# Patient Record
Sex: Male | Born: 1960 | Race: White | Hispanic: No | Smoking: Never smoker
Health system: Southern US, Community
[De-identification: ages and names within clinical notes are randomized; demographics above are authoritative.]

## PROBLEM LIST (undated history)

## (undated) DIAGNOSIS — G4730 Sleep apnea, unspecified: Secondary | ICD-10-CM

## (undated) DIAGNOSIS — F609 Personality disorder, unspecified: Secondary | ICD-10-CM

## (undated) DIAGNOSIS — F32A Depression, unspecified: Secondary | ICD-10-CM

## (undated) DIAGNOSIS — I519 Heart disease, unspecified: Secondary | ICD-10-CM

## (undated) DIAGNOSIS — I10 Essential (primary) hypertension: Secondary | ICD-10-CM

## (undated) DIAGNOSIS — G4700 Insomnia, unspecified: Secondary | ICD-10-CM

## (undated) DIAGNOSIS — F4310 Post-traumatic stress disorder, unspecified: Secondary | ICD-10-CM

## (undated) DIAGNOSIS — I421 Obstructive hypertrophic cardiomyopathy: Secondary | ICD-10-CM

## (undated) DIAGNOSIS — F419 Anxiety disorder, unspecified: Secondary | ICD-10-CM

## (undated) DIAGNOSIS — E785 Hyperlipidemia, unspecified: Secondary | ICD-10-CM

## (undated) DIAGNOSIS — K219 Gastro-esophageal reflux disease without esophagitis: Secondary | ICD-10-CM

## (undated) DIAGNOSIS — E119 Type 2 diabetes mellitus without complications: Secondary | ICD-10-CM

## (undated) DIAGNOSIS — F329 Major depressive disorder, single episode, unspecified: Secondary | ICD-10-CM

## (undated) HISTORY — DX: Heart disease, unspecified: I51.9

## (undated) HISTORY — DX: Obstructive hypertrophic cardiomyopathy: I42.1

## (undated) HISTORY — DX: Insomnia, unspecified: G47.00

## (undated) HISTORY — DX: Personality disorder, unspecified: F60.9

## (undated) HISTORY — DX: Post-traumatic stress disorder, unspecified: F43.10

## (undated) HISTORY — DX: Hyperlipidemia, unspecified: E78.5

## (undated) HISTORY — PX: OTHER SURGICAL HISTORY: SHX169

## (undated) HISTORY — DX: Type 2 diabetes mellitus without complications: E11.9

## (undated) HISTORY — DX: Gastro-esophageal reflux disease without esophagitis: K21.9

## (undated) HISTORY — DX: Sleep apnea, unspecified: G47.30

## (undated) HISTORY — PX: TONSILLECTOMY: SUR1361

---

## 2001-03-26 ENCOUNTER — Emergency Department (HOSPITAL_COMMUNITY): Admission: EM | Admit: 2001-03-26 | Discharge: 2001-03-26 | Payer: Self-pay | Admitting: Emergency Medicine

## 2001-03-26 ENCOUNTER — Encounter: Payer: Self-pay | Admitting: Emergency Medicine

## 2001-04-26 ENCOUNTER — Ambulatory Visit (HOSPITAL_COMMUNITY): Admission: RE | Admit: 2001-04-26 | Discharge: 2001-04-26 | Payer: Self-pay | Admitting: Cardiovascular Disease

## 2001-07-30 ENCOUNTER — Ambulatory Visit (HOSPITAL_BASED_OUTPATIENT_CLINIC_OR_DEPARTMENT_OTHER): Admission: RE | Admit: 2001-07-30 | Discharge: 2001-07-30 | Payer: Self-pay | Admitting: Pulmonary Disease

## 2003-06-19 ENCOUNTER — Ambulatory Visit (HOSPITAL_BASED_OUTPATIENT_CLINIC_OR_DEPARTMENT_OTHER): Admission: RE | Admit: 2003-06-19 | Discharge: 2003-06-19 | Payer: Self-pay | Admitting: Pulmonary Disease

## 2004-10-24 ENCOUNTER — Ambulatory Visit: Payer: Self-pay | Admitting: Pulmonary Disease

## 2012-03-02 ENCOUNTER — Encounter: Payer: Self-pay | Admitting: *Deleted

## 2013-04-04 ENCOUNTER — Encounter: Payer: Self-pay | Admitting: Cardiovascular Disease

## 2014-01-07 ENCOUNTER — Encounter (HOSPITAL_COMMUNITY): Payer: Self-pay | Admitting: Emergency Medicine

## 2014-01-07 ENCOUNTER — Emergency Department (INDEPENDENT_AMBULATORY_CARE_PROVIDER_SITE_OTHER)
Admission: EM | Admit: 2014-01-07 | Discharge: 2014-01-07 | Disposition: A | Discharge status: Home / Self Care | Payer: Medicaid Other | Source: Home / Self Care | Attending: Emergency Medicine | Admitting: Emergency Medicine

## 2014-01-07 DIAGNOSIS — R739 Hyperglycemia, unspecified: Secondary | ICD-10-CM

## 2014-01-07 DIAGNOSIS — R7309 Other abnormal glucose: Secondary | ICD-10-CM

## 2014-01-07 DIAGNOSIS — I10 Essential (primary) hypertension: Secondary | ICD-10-CM

## 2014-01-07 HISTORY — DX: Essential (primary) hypertension: I10

## 2014-01-07 HISTORY — DX: Depression, unspecified: F32.A

## 2014-01-07 HISTORY — DX: Anxiety disorder, unspecified: F41.9

## 2014-01-07 HISTORY — DX: Major depressive disorder, single episode, unspecified: F32.9

## 2014-01-07 LAB — POCT I-STAT, CHEM 8
BUN: 10 mg/dL (ref 6–23)
CALCIUM ION: 1.18 mmol/L (ref 1.12–1.23)
CREATININE: 1 mg/dL (ref 0.50–1.35)
Chloride: 104 mEq/L (ref 96–112)
GLUCOSE: 175 mg/dL — AB (ref 70–99)
HCT: 51 % (ref 39.0–52.0)
Hemoglobin: 17.3 g/dL — ABNORMAL HIGH (ref 13.0–17.0)
Potassium: 4.6 mEq/L (ref 3.7–5.3)
SODIUM: 143 meq/L (ref 137–147)
TCO2: 27 mmol/L (ref 0–100)

## 2014-01-07 MED ORDER — CARVEDILOL 12.5 MG PO TABS: 12.5000 mg | ORAL_TABLET | Freq: Two times a day (BID) | ORAL | Status: DC

## 2014-01-07 MED ORDER — LISINOPRIL 20 MG PO TABS: 20.0000 mg | ORAL_TABLET | Freq: Every day | ORAL | Status: DC

## 2014-01-07 NOTE — Discharge Instructions (Signed)
Blood pressure over the ideal can put you at higher risk for stroke, heart disease, and kidney failure.  For this reason, it's important to try to get your blood pressure as close as possible to the ideal. ° °The ideal blood pressure is 120/80.  Blood pressures from 120-139 systolic over 80-89 diastolic are labeled as "prehypertension."  This means you are at higher risk of developing hypertension in the future.  Blood pressures in this range are not treated with medication, but lifestyle changes are recommended to prevent progression to hypertension.  Blood pressures of 140 and above systolic over 90 and above diastolic are classified as hypertension and are treated with medications. ° °Lifestyle changes which can benefit both prehypertension and hypertension include the following: ° °· Salt and sodium restriction. °· Weight loss. °· Regular exercise. °· Avoidance of tobacco. °· Avoidance of excess alcohol. °· The "D.A.S.H" diet. ° °· People with hypertension and prehypertension should limit their salt intake to less than 1500 mg daily.  Reading the nutrition information on the label of many prepared foods can give you an idea of how much sodium you're consuming at each meal.  Remember that the most important number on the nutrition information is the serving size.  It may be smaller than you think.  Try to avoid adding extra salt at the table.  You may add small amounts of salt while cooking.  Remember that salt is an acquired taste and you may get used to a using a whole lot less salt than you are using now.  Using less salt lets the food's natural flavors come through.  You might want to consider using salt substitutes, potassium chloride, pepper, or blends of herbs and spices to enhance the flavor of your food.  Foods that contain the most salt include: processed meats (like ham, bacon, lunch meat, sausage, hot dogs, and breakfast meat), chips, pretzels, salted nuts, soups, salty snacks, canned foods, junk  food, fast food, restaurant food, mustard, pickles, pizza, popcorn, soy sauce, and worcestershire sauce--quite a list!  You might ask, "Is there anything I can eat?"  The answer is, "yes."  Fruits and vegetables are usually low in salt.  Fresh is better than frozen which is better than canned.  If you have canned vegetables, you can cut down on the salt content by rinsing them in tap water 3 times before cooking.   ° °· Weight loss is the second thing you can do to lower your blood pressure.  Getting to and maintaining ideal weight will often normalize your blood pressure and allow you to avoid medications, entirely, cut way down on your dosage of medications, or allow to wean off your meds.  (Note, this should only be done under the supervision of your primary care doctor.)  Of course, weight loss takes time and you may need to be on medication in the meantime.  You shoot for a body mass index of 20-25.  When you go to the urgent care or to your primary care doctor, they should calculate your BMI.  If you don't know what it is, ask.  You can calculate your BMI with the following formula:  Weight in pounds x 703/ (height in inches) x (height in inches).  There are many good diets out there: Weight Watchers and the D.A.S.H. Diet are the best, but often, just modifying a few factors can be helpful:  Don't skip meals, don't eat out, and keeping a food diary.  I do not recommend   fad diets or diet pills which often raise blood pressure.  ° °· Everyone should get regular exercise, but this is particularly important for people with high blood pressure.  Just about any exercise is good.  The only exercise which may be harmful is lifting extreme heavy weights.  I recommend moderate exercise such as walking for 30 minutes 5 days a week.  Going to the gym for a 50 minute workout 3 times a week is also good.  This amounts to 150 minutes of exercise weekly. ° °· Anyone with high blood pressure should avoid any use of tobacco.   Tobacco use does not elevate blood pressure, but it increases the risk of heart disease and stroke.  If you are interested in quitting, discuss with your doctor how to quit.  If you are not interested in quitting, ask yourself, "What would my life be like in 10 years if I continue to smoke?"  "How will I know when it is time to quit?"  "How would my life be better if I were to quit." ° °· Excess alcohol intake can raise the blood pressure.  The safe alcohol intake is 2 drinks or less per day for men and 1 drink per day or less for women. ° °· There is a very good diet which I recommend that has been designed for people with blood pressure called the D.A.S.H. Diet (dietary approaches to stop hypertension).  It consists of fruits, vegetables, lean meats, low fat dairy, whole grains, nuts and seeds.  It is very low in salt and sodium.  It has also been found to have other beneficial health effects such as lowering cholesterol and helping lose weight.  It has been developed by the [employer] of [employer] and can be downloaded from the internet without any cost. Just do a web search on "D.A.S.H. Diet." or go the NIH website (www.nih.gov).  There are also cookbooks and diet plans that can be gotten from [employer] to help you with this diet. °·  °

## 2014-01-07 NOTE — ED Provider Notes (Signed)
Chief Complaint:   Chief Complaint  Patient presents with  . Hypertension    History of Present Illness:   Richard [name] is a 53 year old male who has a many year history of high blood pressure. He had been under the care of Dr. Delane Ginger for this. He also has high output congestive heart failure. He had been on lisinopril of unknown dose and carvedilol 25 mg twice a day. With this his blood pressure had been under good control and he had had no medication side effects. He was let go from his job, lost his insurance, and unable to followup with Dr. Elease Hashimoto. He's been off of his medications for months. Yesterday he became concerned and checked his blood pressure. It was 180/110 and 200/127. He notes he's gained weight over the past year, but has cut out sweets and sodas and is losing weight again. Yesterday he felt lightheaded and had a panic attack. He denies any headaches, diplopia, blurry vision, shortness of breath, chest pain, tightness, pressure, edema, or strokelike symptoms. He has no history of diabetes, elevated cholesterol, coronary artery disease, kidney disease, or cigarette smoking.  Review of Systems:  Other than noted above, the patient denies any of the following symptoms: Systemic:  No fever, chills, fatigue, weight loss or gain. Respiratory:  No coughing, wheezing, or shortness of breath. Cardiac:  No chest pain, tightness, pressure, palpitations, syncope, or edema. Neuro:  No headache, dizziness, blurred vision, weakness, paresthesias, or strokelike symptoms.  PMFSH:  Past medical history, family history, social history, meds, and allergies were reviewed.  Physical Exam:   Vital signs:  BP 162/97  Pulse 107  Temp(Src) 98.8 F (37.1 C) (Oral)  Resp 18  SpO2 97% General:  Alert, oriented, in no distress. Lungs:  Breath sounds clear and equal bilaterally.  No wheezes, rales, or rhonchi. Heart:  Regular rhythm, no gallops, murmers, clicks or rubs.  Abdomen:  Soft and  flat.  Nontender, no organomegaly or mass.  No pulsatile midline abdominal mass or bruit. Ext:  No edema, pulses full. Neurological exam:  Alert and oriented.  Speech is clear.  No pronator drift.  CNs intact.  Labs:   Results for orders placed during the hospital encounter of 01/07/14  POCT I-STAT, CHEM 8      Result Value Range   Sodium 143  137 - 147 mEq/L   Potassium 4.6  3.7 - 5.3 mEq/L   Chloride 104  96 - 112 mEq/L   BUN 10  6 - 23 mg/dL   Creatinine, Ser 0.03  0.50 - 1.35 mg/dL   Glucose, Bld 491 (*) 70 - 99 mg/dL   Calcium, Ion 7.91  5.05 - 1.23 mmol/L   TCO2 27  0 - 100 mmol/L   Hemoglobin 17.3 (*) 13.0 - 17.0 g/dL   HCT 69.7  94.8 - 01.6 %    Assessment:  The primary encounter diagnosis was Hypertension. A diagnosis of Hyperglycemia was also pertinent to this visit.  Discussed the need to find a primary care physician and he was referred to our [facility] and [facility]. I also discussed salt and sodium restriction as well as his elevated blood glucose. For right now not going to start him on any medication for this, I prefer that he followup with a primary care physician. I told him that weight loss and better diet as well as exercise would definitely help this.  Plan:   1.  Meds:  The following meds were prescribed:  Discharge Medication List as of 01/07/2014  4:50 PM    START taking these medications   Details  !! carvedilol (COREG) 12.5 MG tablet Take 1 tablet (12.5 mg total) by mouth 2 (two) times daily with a meal., Starting 01/07/2014, Until Discontinued, Normal    !! lisinopril (PRINIVIL,ZESTRIL) 20 MG tablet Take 1 tablet (20 mg total) by mouth daily., Starting 01/07/2014, Until Discontinued, Normal     !! - Potential duplicate medications found. Please discuss with provider.      2.  Patient Education/Counseling:  The patient was given appropriate handouts, self care instructions, and instructed in symptomatic relief. Specifically discussed salt and  sodium restriction, weight control, and exercise.  3.  Follow up:  The patient was told to follow up if no better in 3 to 4 days, if becoming worse in any way, and given some red flag symptoms such as severe headache, blurry vision, shortness of breath, chest pain, or any new neurological symptoms which would prompt immediate return.  [facility] within the next month, he has enough medication to last for 3 months.     Richard Likesavid C Zalmen Wrightsman, MD 01/07/14 2038

## 2014-01-07 NOTE — ED Notes (Signed)
Pt c/o HTN... Reports he has been out of Coreg and Lisinopril onset 5 yrs due to loss of health ins Denies: CP, SOB, nauseas, diaphoresis Hx of depression, anxiety, CHF He is alert w/no signs of acute distress.

## 2015-10-25 ENCOUNTER — Ambulatory Visit (INDEPENDENT_AMBULATORY_CARE_PROVIDER_SITE_OTHER): Payer: Medicaid Other | Admitting: Neurology

## 2015-10-25 ENCOUNTER — Encounter: Payer: Self-pay | Admitting: Neurology

## 2015-10-25 VITALS — BP 124/84 | HR 76 | Resp 20

## 2015-10-25 DIAGNOSIS — J301 Allergic rhinitis due to pollen: Secondary | ICD-10-CM

## 2015-10-25 DIAGNOSIS — G4710 Hypersomnia, unspecified: Secondary | ICD-10-CM

## 2015-10-25 DIAGNOSIS — F4312 Post-traumatic stress disorder, chronic: Secondary | ICD-10-CM

## 2015-10-25 DIAGNOSIS — G4730 Sleep apnea, unspecified: Secondary | ICD-10-CM | POA: Diagnosis not present

## 2015-10-25 DIAGNOSIS — J0120 Acute ethmoidal sinusitis, unspecified: Secondary | ICD-10-CM

## 2015-10-25 MED ORDER — BUDESONIDE 32 MCG/ACT NA SUSP: 1.0000 | Freq: Every day | NASAL | Status: DC

## 2015-10-25 NOTE — Patient Instructions (Signed)
Posttraumatic Stress Disorder Posttraumatic stress disorder (PTSD) is a mental disorder. It occurs after a traumatic event in your life. The traumatic events that cause PTSD are outside the range of normal human experience. Examples of these events include war, automobile accidents, natural disasters, rape, domestic violence, and violent crimes. Most people who experience these types of events are able to heal on their own. Those who do not heal develop PTSD. PTSD can happen to anyone at any age. However, people with a history of childhood abuse are at increased risk for developing PTSD.  SYMPTOMS  The traumatic event that causes PTSD must be a threat to life, cause serious injury, or involve sexual violence. The traumatic event is usually experienced directly by the person who develops PTSD. Sometimes PTSD occurs in people who witness traumas that occur to others or who hear about a trauma that occurs to a close family member or friend. The following behaviors are characteristic of people with PTSD:  People with PTSD re-experience the traumatic event in one or more of the following ways (intrusion symptoms):  Recurrent, unwanted distressing memories while awake.  Recurrent distressing dreams.  Sensations similar to those felt when the event originally occurred (flashbacks).   Intense or prolonged emotional distress, triggered by reminders of the trauma. This may include fear, horror, intense sadness, or anger.  Marked physical reactions, triggered by reminders of the trauma. This may include racing heart, shortness of breath, sweating, and shaking.  People with PTSD avoid thoughts, conversations, people, or activities that remind them of the traumatic event (avoidance symptoms).  People with PTSD have negative changes in their thinking and mood after the traumatic event. These changes include:  Inability to remember one or more significant aspects of the traumatic event (memory  gaps).  Exaggerated negative perceptions about themselves or others, such as believing that they are bad people or that no one can be trusted.  Unrealistic assignment of blame to themselves or others for the traumatic event.  Persistent negative emotional state, such as fear, horror, anger, sadness, guilt, or shame.  Markedly decreased interest or participation in significant activities.  A loss of connection with other people.  Inability to experience positive emotions, such as happiness or love.  People with PTSD are more sensitive to their environment and react more easily than others (hyperarousal-overreactivity symptoms). These symptoms include:  Irritability, with angry outbursts toward other people or objects. The outbursts are easily triggered and may be verbal or physical.  Careless or self-destructive behavior. This may include reckless driving or drug use.  A feeling of being on edge, with increased alertness (hypervigilance).  Exaggerated reactions to stimuli, such as being easily startled.   Difficulty concentrating.  Difficulty sleeping. PTSD symptoms may start soon after a frightening event or months or years later. They last at least 1 month or longer and can affect one or more areas of functioning, such as social or occupational functioning.  DIAGNOSIS  PTSD is diagnosed through an assessment by a mental health professional. You will be asked questions about the traumatic events in your life. You will also be asked about how these events have changed your thoughts, mood, behavior, and ability to function on a daily basis. You may be asked about your use of alcohol or drugs, which can make PTSD symptoms worse. TREATMENT  Unlike many mental disorders, which require lifelong management, PTSD is a curable condition. The goal of PTSD treatment is to neutralize the negative effects of the traumatic event on daily   functioning, not erase the memory of the event. The  following treatments may be prescribed to reach this goal:  Medicines. Certain medicines can reduce some PTSD symptoms. Intrusion symptoms and hyperarousal-overactivity symptoms respond best to medicines.  Counseling (talk therapy). Talk therapy with a mental health professional who is experienced in treating PTSD can help. Talk therapy can provide education, emotional support, and coping skills. Certain types of talk therapy that specifically target the traumatic events are the most effective treatment for PTSD:  Prolonged exposure therapy, which involves remembering and processing the traumatic event with a therapist in a safe environment until it no longer creates a negative emotional response.  Eye movement desensitization and reprocessing therapy, which involves the use of repetitive physical stimulation of the senses that alternates between the right and left sides of the body. It is believed that this therapy facilitates communication between the two sides of the brain. This communication helps the mind to integrate the fragmented memories of the traumatic event into a whole story that makes sense and no longer creates a negative emotional response. Most people with PTSD benefit from a combination of these treatments.    This information is not intended to replace advice given to you by your health care provider. Make sure you discuss any questions you have with your health care provider.   Document Released: 09/02/2001 Document Revised: 12/29/2014 Document Reviewed: 02/24/2013 [employer] Interactive Patient Education 2016 [employer]  
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
## 2015-10-30 ENCOUNTER — Ambulatory Visit (INDEPENDENT_AMBULATORY_CARE_PROVIDER_SITE_OTHER): Payer: Medicaid Other | Admitting: Neurology

## 2015-10-30 DIAGNOSIS — G4710 Hypersomnia, unspecified: Secondary | ICD-10-CM

## 2015-10-30 DIAGNOSIS — G4730 Sleep apnea, unspecified: Secondary | ICD-10-CM | POA: Diagnosis not present

## 2015-10-30 DIAGNOSIS — F4312 Post-traumatic stress disorder, chronic: Secondary | ICD-10-CM

## 2015-10-30 DIAGNOSIS — J301 Allergic rhinitis due to pollen: Secondary | ICD-10-CM

## 2015-10-30 DIAGNOSIS — J0120 Acute ethmoidal sinusitis, unspecified: Secondary | ICD-10-CM

## 2015-11-12 ENCOUNTER — Ambulatory Visit (INDEPENDENT_AMBULATORY_CARE_PROVIDER_SITE_OTHER): Payer: Medicaid Other | Admitting: Neurology

## 2015-11-12 DIAGNOSIS — G4730 Sleep apnea, unspecified: Secondary | ICD-10-CM

## 2015-11-12 DIAGNOSIS — J301 Allergic rhinitis due to pollen: Secondary | ICD-10-CM

## 2015-11-12 DIAGNOSIS — G4710 Hypersomnia, unspecified: Secondary | ICD-10-CM | POA: Diagnosis not present

## 2015-11-12 NOTE — Sleep Study (Signed)
Please see the scanned sleep study interpretation located in the Procedure tab within the Chart Review section. 

## 2015-11-13 ENCOUNTER — Telehealth: Payer: Self-pay

## 2015-11-13 DIAGNOSIS — G4733 Obstructive sleep apnea (adult) (pediatric): Secondary | ICD-10-CM

## 2015-11-13 NOTE — Telephone Encounter (Signed)
Called pt to discuss sleep study results. No answer, left a message asking him to call me back. 

## 2015-11-14 NOTE — Telephone Encounter (Signed)
Spoke to pt and advised him that his PSG revealed severest osa and immediate treatment is advised. PAP therapy is indicated. I advised him that Dr. Vickey Huger recommended an attended PAP titration study to optimize therapy. Pt is willing to come in for a cpap titration. Pt understands that our sleep lab will call him to schedule the appt.  I advised him to avoid sedative hypnotics and alcohol which may worsen osa. I advise him to lose weight, diet, and exercise if not contraindicated. I advised him to avoid driving or operating hazardous machinery while sleepy. I also advised him that he might need supplemental oxygen. Pt verbalized understanding.

## 2015-11-14 NOTE — Telephone Encounter (Signed)
Called pt to discuss sleep study results. No answer, left a message asking him to call back. 

## 2015-12-10 ENCOUNTER — Ambulatory Visit (INDEPENDENT_AMBULATORY_CARE_PROVIDER_SITE_OTHER): Payer: Medicaid Other | Admitting: Neurology

## 2015-12-10 DIAGNOSIS — G4733 Obstructive sleep apnea (adult) (pediatric): Secondary | ICD-10-CM

## 2015-12-10 NOTE — Sleep Study (Signed)
Please see the scanned sleep study interpretation located in the Procedure tab within the Chart Review section. 

## 2015-12-12 ENCOUNTER — Telehealth: Payer: Self-pay

## 2015-12-12 NOTE — Telephone Encounter (Signed)
Spoke to pt regarding his sleep study results. I advised pt that his PAP titration was a difficult and incomplete study. No optimal pressure could be identified on CPAP. Dr. Vickey Huger recommends starting a BiPAP auto form 10/6 to 20/16 cm H2O. Pt states that he has tried Bipap before and did not tolerate it very well. He is unsure about this. He wants a follow up appt to discuss further what to do. Appt was made for 12/27 at 1:30. Pt verbalized understanding.

## 2015-12-12 NOTE — Telephone Encounter (Signed)
Called to discuss pt's sleep study. No answer, left message asking him to call me back.

## 2015-12-18 ENCOUNTER — Ambulatory Visit (INDEPENDENT_AMBULATORY_CARE_PROVIDER_SITE_OTHER): Payer: Medicaid Other | Admitting: Neurology

## 2015-12-18 ENCOUNTER — Encounter: Payer: Self-pay | Admitting: Neurology

## 2015-12-18 VITALS — BP 144/82 | HR 88 | Resp 20 | Ht 72.0 in | Wt 308.0 lb

## 2015-12-18 DIAGNOSIS — G4733 Obstructive sleep apnea (adult) (pediatric): Secondary | ICD-10-CM | POA: Diagnosis not present

## 2015-12-18 NOTE — Addendum Note (Signed)
Addended by: Geronimo Running A on: 12/18/2015 02:13 PM   Modules accepted: Orders
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
## 2016-02-12 ENCOUNTER — Telehealth: Payer: Self-pay

## 2016-02-12 NOTE — Telephone Encounter (Signed)
Dr. Vickey Huger is going to be out of the office 02/28/2016. Pt's appt needs to be rescheduled.  He has just started bipap on 01/31/2016 and insurance will require 60-90 days of data on bipap at pt's appt with Dr. Vickey Huger. Therefore, pt cannot be rescheduled with Dr. Vickey Huger until after 03/30/16.  Can you please call pt and reschedule him for after 03/30/16 for his bipap follow up?

## 2016-02-28 ENCOUNTER — Ambulatory Visit: Payer: Medicaid Other | Admitting: Neurology

## 2016-03-31 ENCOUNTER — Ambulatory Visit (INDEPENDENT_AMBULATORY_CARE_PROVIDER_SITE_OTHER): Payer: Medicaid Other | Admitting: Neurology

## 2016-03-31 ENCOUNTER — Encounter: Payer: Self-pay | Admitting: *Deleted

## 2016-03-31 ENCOUNTER — Encounter: Payer: Self-pay | Admitting: Neurology

## 2016-03-31 VITALS — BP 135/78 | HR 72 | Resp 12 | Ht 72.0 in | Wt 313.5 lb

## 2016-03-31 DIAGNOSIS — J310 Chronic rhinitis: Secondary | ICD-10-CM | POA: Diagnosis not present

## 2016-03-31 DIAGNOSIS — G4733 Obstructive sleep apnea (adult) (pediatric): Secondary | ICD-10-CM | POA: Diagnosis not present

## 2016-03-31 MED ORDER — FLUTICASONE PROPIONATE 50 MCG/ACT NA SUSP: 1.0000 | Freq: Every day | NASAL | Status: DC

## 2016-03-31 NOTE — Progress Notes (Signed)
Faxed printed rx fluticasone to pt pharmacy. Fax: [phone]. Received confirmation.
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
## 2016-03-31 NOTE — Patient Instructions (Signed)

## 2016-10-02 ENCOUNTER — Encounter: Payer: Self-pay | Admitting: Gastroenterology

## 2016-10-30 ENCOUNTER — Ambulatory Visit: Payer: Medicaid Other | Admitting: *Deleted

## 2016-10-30 VITALS — Ht 72.0 in | Wt 305.4 lb

## 2016-10-30 DIAGNOSIS — Z1211 Encounter for screening for malignant neoplasm of colon: Secondary | ICD-10-CM

## 2016-10-30 MED ORDER — NA SULFATE-K SULFATE-MG SULF 17.5-3.13-1.6 GM/177ML PO SOLN: 1.0000 | Freq: Once | ORAL | 0 refills | Status: AC

## 2016-10-30 NOTE — Progress Notes (Signed)
Denies allergies to eggs or soy products. Denies complications with sedation or anesthesia. Denies O2 use. Denies use of diet or weight loss medications.  Emmi instructions given for colonoscopy.  

## 2016-11-27 ENCOUNTER — Encounter: Payer: Self-pay | Admitting: Gastroenterology

## 2016-11-27 ENCOUNTER — Ambulatory Visit (AMBULATORY_SURGERY_CENTER): Payer: Medicaid Other | Admitting: Gastroenterology

## 2016-11-27 VITALS — BP 102/69 | HR 76 | Temp 95.7°F | Resp 12 | Ht 72.0 in | Wt 305.0 lb

## 2016-11-27 DIAGNOSIS — Z1211 Encounter for screening for malignant neoplasm of colon: Secondary | ICD-10-CM | POA: Diagnosis not present

## 2016-11-27 DIAGNOSIS — Z1212 Encounter for screening for malignant neoplasm of rectum: Secondary | ICD-10-CM | POA: Diagnosis not present

## 2016-11-27 LAB — GLUCOSE, CAPILLARY
GLUCOSE-CAPILLARY: 168 mg/dL — AB (ref 65–99)
Glucose-Capillary: 155 mg/dL — ABNORMAL HIGH (ref 65–99)

## 2016-11-27 MED ORDER — SODIUM CHLORIDE 0.9 % IV SOLN: 500.0000 mL | INTRAVENOUS | Status: AC

## 2016-11-27 NOTE — Patient Instructions (Signed)
Discharge instructions given. Handout on diverticulosis. Resume previous medications. YOU HAD AN ENDOSCOPIC PROCEDURE TODAY AT THE [facility]:   Refer to the procedure report that was given to you for any specific questions about what was found during the examination.  If the procedure report does not answer your questions, please call your gastroenterologist to clarify.  If you requested that your care partner not be given the details of your procedure findings, then the procedure report has been included in a sealed envelope for you to review at your convenience later.  YOU SHOULD EXPECT: Some feelings of bloating in the abdomen. Passage of more gas than usual.  Walking can help get rid of the air that was put into your GI tract during the procedure and reduce the bloating. If you had a lower endoscopy (such as a colonoscopy or flexible sigmoidoscopy) you may notice spotting of blood in your stool or on the toilet paper. If you underwent a bowel prep for your procedure, you may not have a normal bowel movement for a few days.  Please Note:  You might notice some irritation and congestion in your nose or some drainage.  This is from the oxygen used during your procedure.  There is no need for concern and it should clear up in a day or so.  SYMPTOMS TO REPORT IMMEDIATELY:   Following lower endoscopy (colonoscopy or flexible sigmoidoscopy):  Excessive amounts of blood in the stool  Significant tenderness or worsening of abdominal pains  Swelling of the abdomen that is new, acute  Fever of 100F or higher   For urgent or emergent issues, a gastroenterologist can be reached at any hour by calling [phone].   DIET:  We do recommend a small meal at first, but then you may proceed to your regular diet.  Drink plenty of fluids but you should avoid alcoholic beverages for 24 hours.  ACTIVITY:  You should plan to take it easy for the rest of today and you should NOT DRIVE or use  heavy machinery until tomorrow (because of the sedation medicines used during the test).    FOLLOW UP: Our staff will call the number listed on your records the next business day following your procedure to check on you and address any questions or concerns that you may have regarding the information given to you following your procedure. If we do not reach you, we will leave a message.  However, if you are feeling well and you are not experiencing any problems, there is no need to return our call.  We will assume that you have returned to your regular daily activities without incident.  If any biopsies were taken you will be contacted by phone or by letter within the next 1-3 weeks.  Please call us at [phone] if you have not heard about the biopsies in 3 weeks.    SIGNATURES/CONFIDENTIALITY: You and/or your care partner have signed paperwork which will be entered into your electronic medical record.  These signatures attest to the fact that that the information above on your After Visit Summary has been reviewed and is understood.  Full responsibility of the confidentiality of this discharge information lies with you and/or your care-partner. 

## 2016-11-27 NOTE — Op Note (Signed)
[facility] Patient Name: Richard LundRobert [name] Procedure Date: 11/27/2016 8:29 AM MRN: [id number] Endoscopist: Sherilyn CooterHenry L. Myrtie Neitheranis , MD Age: 55 Referring MD:  Date of Birth: 09/26/1961 Gender: Male Account #: [id number] Procedure:                Colonoscopy Indications:              Screening in patient at increased risk: Colorectal                            cancer in brother before age 54, This is the                            patient's first colonoscopy Medicines:                Monitored Anesthesia Care Procedure:                Pre-Anesthesia Assessment:                           - Prior to the procedure, a History and Physical                            was performed, and patient medications and                            allergies were reviewed. The patient's tolerance of                            previous anesthesia was also reviewed. The risks                            and benefits of the procedure and the sedation                            options and risks were discussed with the patient.                            All questions were answered, and informed consent                            was obtained. Anticoagulants: The patient has taken                            aspirin. It was decided not to withhold this                            medication prior to the procedure. ASA Grade                            Assessment: II - A patient with mild systemic                            disease. After reviewing the risks and benefits,  the patient was deemed in satisfactory condition to                            undergo the procedure.                           After obtaining informed consent, the colonoscope                            was passed under direct vision. Throughout the                            procedure, the patient's blood pressure, pulse, and                            oxygen saturations were monitored continuously. The                     Model CF-HQ190L [id number]) scope was introduced                            through the anus and advanced to the the cecum,                            identified by appendiceal orifice and ileocecal                            valve. The quality of the bowel preparation was                            evaluated using the BBPS [facility] Bowel Preparation                            Scale) with scores of: Right Colon = 2, Transverse                            Colon = 3 and Left Colon = 2. The total BBPS score                            equals 7. The quality of the bowel preparation was                            good (after irrigation). The bowel preparation used                            was SUPREP. The ileocecal valve, appendiceal                            orifice, and rectum were photographed. Scope In: 8:49:49 AM Scope Out: 9:02:44 AM Scope Withdrawal Time: 0 hours 9 minutes 57 seconds  Total Procedure Duration: 0 hours 12 minutes 55 seconds  Findings:                 The perianal and digital rectal examinations were  normal.                           Multiple small-mouthed diverticula were found in                            the left colon.                           The exam was otherwise without abnormality on                            direct and retroflexion views. Complications:            No immediate complications. Estimated Blood Loss:     Estimated blood loss: none. Impression:               - Diverticulosis in the left colon.                           - The examination was otherwise normal on direct                            and retroflexion views.                           - No specimens collected. Recommendation:           - Patient has a contact number available for                            emergencies. The signs and symptoms of potential                            delayed complications were discussed with the                             patient. Return to normal activities tomorrow.                            Written discharge instructions were provided to the                            patient.                           - Resume previous diet.                           - Continue present medications.                           - Repeat colonoscopy in 5 years for screening                            purposes. Richard [name] L. Myrtie Neither, MD 11/27/2016 9:07:18 AM This report has been signed electronically.

## 2016-11-28 ENCOUNTER — Telehealth: Payer: Self-pay

## 2016-11-28 NOTE — Telephone Encounter (Signed)
  Follow up Call-  Call back number 11/27/2016  Post procedure Call Back phone  # [phone]  Permission to leave phone message Yes  Some recent data might be hidden     Patient questions:  Do you have a fever, pain , or abdominal swelling? No. Pain Score  0 *  Have you tolerated food without any problems? Yes.    Have you been able to return to your normal activities? Yes.    Do you have any questions about your discharge instructions: Diet   No. Medications  No. Follow up visit  No.  Do you have questions or concerns about your Care? No.  Actions: * If pain score is 4 or above: No action needed, pain <4.

## 2017-03-30 ENCOUNTER — Telehealth: Payer: Self-pay

## 2017-03-30 NOTE — Telephone Encounter (Signed)
Opened in error

## 2017-03-31 ENCOUNTER — Ambulatory Visit (INDEPENDENT_AMBULATORY_CARE_PROVIDER_SITE_OTHER): Payer: Medicaid Other | Admitting: Neurology

## 2017-03-31 ENCOUNTER — Encounter: Payer: Self-pay | Admitting: Neurology

## 2017-03-31 ENCOUNTER — Encounter (INDEPENDENT_AMBULATORY_CARE_PROVIDER_SITE_OTHER): Payer: Self-pay

## 2017-03-31 VITALS — BP 132/76 | HR 84 | Resp 20 | Ht 72.0 in | Wt 307.0 lb

## 2017-03-31 DIAGNOSIS — Z9989 Dependence on other enabling machines and devices: Secondary | ICD-10-CM

## 2017-03-31 DIAGNOSIS — G4733 Obstructive sleep apnea (adult) (pediatric): Secondary | ICD-10-CM

## 2017-03-31 DIAGNOSIS — Z9114 Patient's other noncompliance with medication regimen: Secondary | ICD-10-CM | POA: Diagnosis not present

## 2017-03-31 DIAGNOSIS — Z91199 Patient's noncompliance with other medical treatment and regimen due to unspecified reason: Secondary | ICD-10-CM | POA: Insufficient documentation
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
## 2017-10-01 ENCOUNTER — Ambulatory Visit: Payer: Medicaid Other | Admitting: Neurology

## 2019-03-02 ENCOUNTER — Telehealth: Payer: Self-pay | Admitting: *Deleted

## 2019-03-02 ENCOUNTER — Encounter: Payer: Self-pay | Admitting: *Deleted

## 2019-03-02 NOTE — Telephone Encounter (Signed)
Referral/Appointment Request send to scheduling [facility] [place], [place] P# [phone], F# [phone]

## 2019-05-05 ENCOUNTER — Telehealth: Payer: Self-pay | Admitting: Cardiovascular Disease

## 2019-05-05 NOTE — Telephone Encounter (Signed)
Spoke with patient who confirmed all demographics. Patient has a smart phone and uses My Chart. Will have vitals ready for visit. °

## 2019-05-09 ENCOUNTER — Encounter: Payer: Self-pay | Admitting: Cardiovascular Disease

## 2019-05-09 ENCOUNTER — Other Ambulatory Visit: Payer: Self-pay

## 2019-05-09 ENCOUNTER — Telehealth (INDEPENDENT_AMBULATORY_CARE_PROVIDER_SITE_OTHER): Payer: Medicaid Other | Admitting: Cardiovascular Disease

## 2019-05-09 DIAGNOSIS — I5022 Chronic systolic (congestive) heart failure: Secondary | ICD-10-CM

## 2019-05-09 DIAGNOSIS — Z7189 Other specified counseling: Secondary | ICD-10-CM

## 2019-05-09 HISTORY — DX: Chronic systolic (congestive) heart failure: I50.22

## 2019-05-09 NOTE — Progress Notes (Signed)
Virtual Visit via Video Note   This visit type was conducted due to national recommendations for restrictions regarding the COVID-19 Pandemic (e.g. social distancing) in an effort to limit this patient's exposure and mitigate transmission in our community.  Due to his co-morbid illnesses, this patient is at least at moderate risk for complications without adequate follow up.  This format is felt to be most appropriate for this patient at this time.  All issues noted in this document were discussed and addressed.  A limited physical exam was performed with this format.  Please refer to the patient's chart for his consent to telehealth for [facility].   Date:  05/09/2019   ID:  Richard [name], DOB 11-22-1961, MRN [id number]  Patient Location: Home Provider Location: Home  PCP:  [facility]  Cardiologist:   Nahser  Electrophysiologist:  None   Evaluation Performed:  New Patient Evaluation  Chief Complaint:  CHF   Problem list 1.  Chronic systolic congestive heart failure 2.  Morbid obesity 3.  Diabetes mellitus 4.  Obstructive sleep apnea  May 09, 2019    Richard [name] is a 58 y.o. male with  Hx of CHF, morbid obesity,  DM.  Richard [name] was seen 18 years ago with CHF Had a cath - thought to have an intracardiac shunt.  Had normal cors.  Had high O2 sats in his mixed venous blood.     No real symptoms of CHF EF 35% , has been as high as 45% on some echos Recent echo in the [facility] system shows EF of 35%. Does not exercise  Does not watch his food.   Admits that he eats a very poor diet.    Wt.  289 lbs today  No leg swelling Has some DOE with exercise Able to mow the lawn without difficulties.   The patient does not have symptoms concerning for COVID-19 infection (fever, chills, cough, or new shortness of breath).    Past Medical History:  Diagnosis Date  . Anxiety   . Depression   . Diabetes mellitus, type 2 (HCC)   . GERD  (gastroesophageal reflux disease)   . HOCM (hypertrophic obstructive cardiomyopathy) (HCC)   . Hyperlipidemia   . Hypertension   . Insomnia   . Left ventricular dysfunction    EF 35%  . Personality disorder (HCC)   . PTSD (post-traumatic stress disorder)   . Sleep apnea    Past Surgical History:  Procedure Laterality Date  . cardiac catherization    . TONSILLECTOMY       Current Meds  Medication Sig  . Ascorbic Acid (VITAMIN C PO) Take by mouth.  Marland Kitchen aspirin EC 81 MG tablet Take 81 mg by mouth daily.  Marland Kitchen atorvastatin (LIPITOR) 20 MG tablet Take 20 mg by mouth daily.  Marland Kitchen buPROPion (WELLBUTRIN XL) 300 MG 24 hr tablet Take 150 mg by mouth 2 (two) times a day.   . carvedilol (COREG) 25 MG tablet Take 25 mg by mouth 2 (two) times daily with a meal.  . Cholecalciferol (VITAMIN D PO) Take by mouth.  . dapagliflozin propanediol (FARXIGA) 10 MG TABS tablet Take 10 mg by mouth daily.  Marland Kitchen FLUoxetine (PROZAC) 40 MG capsule Take 40 mg by mouth daily.  Marland Kitchen FOLIC ACID PO Take by mouth.  Marland Kitchen GLIPIZIDE PO Take 10 mg by mouth 2 (two) times daily before a meal.   . lisinopril (PRINIVIL,ZESTRIL) 20 MG tablet Take 1 tablet (20 mg total)  by mouth daily.  Marland Kitchen. METFORMIN HCL PO Take 1,000 mg by mouth 2 (two) times daily with a meal.   . omeprazole (PRILOSEC) 20 MG capsule Take 20 mg by mouth daily.   Current Facility-Administered Medications for the 05/09/19 encounter (Telemedicine) with Nahser, Deloris PingPhilip J, MD  Medication  . 0.9 %  sodium chloride infusion     Allergies:   Patient has no known allergies.   Social History   Tobacco Use  . Smoking status: Never Smoker  . Smokeless tobacco: Never Used  Substance Use Topics  . Alcohol use: Yes    Comment: rare  . Drug use: No     Family Hx: The patient's family history includes Colon cancer in his maternal grandfather; Colon cancer (age of onset: 9356) in his brother; Heart attack in an other family member; Heart disease in his father; Hyperlipidemia in his  father; Hypertension in his father and mother; Stroke in his mother. There is no history of Esophageal cancer, Rectal cancer, or Stomach cancer.  ROS:   Please see the history of present illness.     All other systems reviewed and are negative.   Prior CV studies:   The following studies were reviewed today:    Labs/Other Tests and Data Reviewed:    EKG:  No ECG reviewed.  Recent Labs: No results found for requested labs within last 8760 hours.   Recent Lipid Panel No results found for: CHOL, TRIG, HDL, CHOLHDL, LDLCALC, LDLDIRECT  Wt Readings from Last 3 Encounters:  05/09/19 289 lb (131.1 kg)  03/31/17 (!) 307 lb (139.3 kg)  11/27/16 (!) 305 lb (138.3 kg)     Objective:    Vital Signs:  BP 116/85   Pulse 76   Ht 6' (1.829 m)   Wt 289 lb (131.1 kg)   BMI 39.20 kg/m    VITAL SIGNS:  reviewed GEN:  no acute distress EYES:  sclerae anicteric, EOMI - Extraocular Movements Intact RESPIRATORY:  normal respiratory effort, symmetric expansion CARDIOVASCULAR:  no peripheral edema SKIN:  no rash, lesions or ulcers. MUSCULOSKELETAL:  no obvious deformities. NEURO:  alert and oriented x 3, no obvious focal deficit PSYCH:  normal affect  ASSESSMENT & PLAN:    1. Chronic Systolic CHF:   Has been seen in the past. Heart cath years ago suggested a left to right shunt  ( old records have been requested )   Seems to be doing well for the most part.  No real symptoms   2.  Depression:   Has numerous psychiatric diagnosis.   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   Tried to make sure he is getting the help he needs.  Advised improved diet, weight loss, exerise   Will see him in 2-3 months   COVID-19 Education: The signs and symptoms of COVID-19 were discussed with the patient and how to seek care for testing (follow up with PCP or arrange E-visit).  The importance of social distancing was discussed today.  Time:   Today, I have spent 33 minutes with the patient with telehealth  technology discussing the above problems.     Medication Adjustments/Labs and Tests Ordered: Current medicines are reviewed at length with the patient today.  Concerns regarding medicines are outlined above.   Tests Ordered: No orders of the defined types were placed in this encounter.   Medication Changes: No orders of the defined types were placed in this encounter.   Disposition:  Follow up in 3 month(s)  Signed,  Kristeen Miss, MD  05/09/2019 1:37 PM    [facility]

## 2019-05-09 NOTE — Patient Instructions (Addendum)
Medication Instructions:  Your physician recommends that you continue on your current medications as directed. Please refer to the Current Medication list given to you today.  If you need a refill on your cardiac medications before your next appointment, please call your pharmacy.   Lab work: None Ordered   Testing/Procedures: None Ordered   Follow-Up: At [facility], you and your health needs are our priority.  As part of our continuing mission to provide you with exceptional heart care, we have created designated Provider Care Teams.  These Care Teams include your primary Cardiologist (physician) and Advanced Practice Providers (APPs -  Physician Assistants and Nurse Practitioners) who all work together to provide you with the care you need, when you need it. You have an appointment in:  3 months on Wednesday Sept. 2 at 11 am with Dr. Elease Hashimoto In the future you may see one of the following Advanced Practice Providers on your designated Care Team: Tereso Newcomer, PA-C [place], [place] . Berton Bon, NP

## 2019-08-02 ENCOUNTER — Telehealth: Payer: Self-pay | Admitting: *Deleted

## 2019-08-02 NOTE — Telephone Encounter (Signed)
   [facility] Pre-operative Risk Assessment    Request for surgical clearance:  1. What type of surgery is being performed? HYOID SUSPENSION   2. When is this surgery scheduled? 08/05/19   3. What type of clearance is required (medical clearance vs. Pharmacy clearance to hold med vs. Both)? MEDICAL  4. Are there any medications that need to be held prior to surgery and how long? ASA   5. Practice name and name of physician performing surgery? [facility]; DR. Anthony Sar   6. What is your office phone number [phone]    7.   What is your office fax number [phone]  8.   Anesthesia type (None, local, MAC, general) ? GENERAL    Richard [name] 08/02/2019, 9:08 AM  _________________________________________________________________   (provider comments below)

## 2019-08-02 NOTE — Telephone Encounter (Signed)
   Primary Cardiologist: Mertie Moores, MD  Chart reviewed as part of pre-operative protocol coverage. Patient was contacted 08/02/2019 in reference to pre-operative risk assessment for pending surgery as outlined below.  Richard [name] was last seen on 05/09/19 by Dr. Acie Fredrickson.  Since that day, Richard [name] has done well.  He can complete more than 4.0 METS without anginal symptoms and denies symptoms of CHF exacerbation.   We generally do not hold ASA during the perioperative period. However, if necessary may hold 5-7 days prior to procedure. Defer to surgeon.   Therefore, based on ACC/AHA guidelines, the patient would be at acceptable risk for the planned procedure without further cardiovascular testing.   I will route this recommendation to the requesting party via Epic fax function and remove from pre-op pool.  Please call with questions.  [place], PA 08/02/2019, 12:17 PM

## 2019-08-24 ENCOUNTER — Ambulatory Visit: Payer: Medicaid Other | Admitting: Cardiovascular Disease

## 2019-10-03 ENCOUNTER — Encounter: Payer: Self-pay | Admitting: Cardiovascular Disease

## 2019-10-03 NOTE — Progress Notes (Signed)
Cardiology Office Note:    Date:  10/04/2019   ID:  Richard [name], DOB [birth year], MRN [id number]  PCP:  [facility]  Cardiologist:  Kristeen Miss, MD  Electrophysiologist:  None   Referring MD: [facility]   Chief Complaint  Patient presents with  . Congestive Heart Failure  . Obesity    History of Present Illness:    Richard [name] is a 58 y.o. male with a hx of HCF, morbid obesity, DM. I saw him in the remote past ( 18 years ago) and then had a telemedicine visit with him May 09, 2019 .  He has high output CHF EF 35%  he has some DOE with exercise  He has lots of anxiety and depression - I suspect he has Aspergerer's or other Spectrum disorder.   Oct. 13, 2020   Richard [name] is seen today for follow up Admits that he is carrying too much weight and is out of shape.  No CP .   ECG has had inferior Q waves   Does not sleep well Sweats quite a bit   Does not exercise  - not inclined  to / too anxious to exercise much .    His primary dx really seems to be anxiety related.  Has lost 7 lbs recently   Past Medical History:  Diagnosis Date  . Anxiety   . Depression   . Diabetes mellitus, type 2 (HCC)   . GERD (gastroesophageal reflux disease)   . HOCM (hypertrophic obstructive cardiomyopathy) (HCC)   . Hyperlipidemia   . Hypertension   . Insomnia   . Left ventricular dysfunction    EF 35%  . Personality disorder (HCC)   . PTSD (post-traumatic stress disorder)   . Sleep apnea     Past Surgical History:  Procedure Laterality Date  . cardiac catherization    . TONSILLECTOMY      Current Medications: Current Meds  Medication Sig  . Ascorbic Acid (VITAMIN C PO) Take by mouth.  Marland Kitchen aspirin EC 81 MG tablet Take 81 mg by mouth daily.  Marland Kitchen atorvastatin (LIPITOR) 20 MG tablet Take 20 mg by mouth daily.  Marland Kitchen buPROPion (WELLBUTRIN XL) 300 MG 24 hr tablet Take 150 mg by mouth 2 (two) times a day.   . carvedilol (COREG) 25  MG tablet Take 25 mg by mouth 2 (two) times daily with a meal.  . Cholecalciferol (VITAMIN D PO) Take by mouth.  . dapagliflozin propanediol (FARXIGA) 10 MG TABS tablet Take 10 mg by mouth daily.  Marland Kitchen FLUoxetine (PROZAC) 40 MG capsule Take 40 mg by mouth daily.  Marland Kitchen FOLIC ACID PO Take by mouth.  Marland Kitchen GLIPIZIDE PO Take 10 mg by mouth 2 (two) times daily before a meal.   . lisinopril (PRINIVIL,ZESTRIL) 20 MG tablet Take 1 tablet (20 mg total) by mouth daily.  Marland Kitchen METFORMIN HCL PO Take 1,000 mg by mouth 2 (two) times daily with a meal.   . omeprazole (PRILOSEC) 20 MG capsule Take 20 mg by mouth daily.   Current Facility-Administered Medications for the 10/04/19 encounter (Office Visit) with , Deloris Ping, MD  Medication  . 0.9 %  sodium chloride infusion     Allergies:   Patient has no known allergies.   Social History   Socioeconomic History  . Marital status: Legally Separated    Spouse name: Not on file  . Number of children: Not on file  . Years of education: Not  on file  . Highest education level: Not on file  Occupational History  . Not on file  Social Needs  . Financial resource strain: Not on file  . Food insecurity    Worry: Not on file    Inability: Not on file  . Transportation needs    Medical: Not on file    Non-medical: Not on file  Tobacco Use  . Smoking status: Never Smoker  . Smokeless tobacco: Never Used  Substance and Sexual Activity  . Alcohol use: Yes    Comment: rare  . Drug use: No  . Sexual activity: Not on file  Lifestyle  . Physical activity    Days per week: Not on file    Minutes per session: Not on file  . Stress: Not on file  Relationships  . Social Herbalist on phone: Not on file    Gets together: Not on file    Attends religious service: Not on file    Active member of club or organization: Not on file    Attends meetings of clubs or organizations: Not on file    Relationship status: Not on file  Other Topics Concern  . Not on  file  Social History Narrative  . Not on file     Family History: The patient's family history includes Colon cancer in his maternal grandfather; Colon cancer (age of onset: 76) in his brother; Heart attack in an other family member; Heart disease in his father; Hyperlipidemia in his father; Hypertension in his father and mother; Stroke in his mother. There is no history of Esophageal cancer, Rectal cancer, or Stomach cancer.  ROS:   Please see the history of present illness.     All other systems reviewed and are negative.  EKGs/Labs/Other Studies Reviewed:    The following studies were reviewed today:   EKG:   Oct. 13, 2020:    NSR 80 ,  Inf. Q waves ( unchanged from previous)    Recent Labs: No results found for requested labs within last 8760 hours.  Recent Lipid Panel No results found for: CHOL, TRIG, HDL, CHOLHDL, VLDL, LDLCALC, LDLDIRECT  Physical Exam:    VS:  BP 116/72   Pulse 80   Ht 6' (1.829 m)   Wt 295 lb 6.4 oz (134 kg)   SpO2 94%   BMI 40.06 kg/m     Wt Readings from Last 3 Encounters:  10/04/19 295 lb 6.4 oz (134 kg)  05/09/19 289 lb (131.1 kg)  03/31/17 (!) 307 lb (139.3 kg)     GEN: middle age, obese man  HEENT: Normal NECK: No JVD; No carotid bruits LYMPHATICS: No lymphadenopathy CARDIAC: RRR, no murmurs, rubs, gallops RESPIRATORY:  Clear to auscultation without rales, wheezing or rhonchi  ABDOMEN: Soft, non-tender, non-distended MUSCULOSKELETAL:  No edema; No deformity  SKIN: Warm and dry NEUROLOGIC:  Alert and oriented x 3 PSYCHIATRIC:  Normal affect   ASSESSMENT:    No diagnosis found. PLAN:    In order of problems listed above:  1. CHF :   He has a history of congestive heart failure in the past.  He had high output failure at the time of heart catheterization.  He has normal coronary arteries.  He now has shortness of breath with exertion but I think that a lot of this is due to his weight.  I encouraged him to work on a diet,  exercise, weight loss program.  Will refer him to the  healthy weight loss program at [facility].  We will get an echocardiogram for further evaluation of his LV function.  2. Anxiety :  He has considerable anxiety.  He is presently seeing counselors.  I will ask around to see if any of the counselors that I know take Medicaid.    Medication Adjustments/Labs and Tests Ordered: Current medicines are reviewed at length with the patient today.  Concerns regarding medicines are outlined above.  No orders of the defined types were placed in this encounter.  No orders of the defined types were placed in this encounter.   There are no Patient Instructions on file for this visit.   Signed, Kristeen Miss, MD  10/04/2019 2:39 PM    [facility]

## 2019-10-04 ENCOUNTER — Encounter: Payer: Self-pay | Admitting: Cardiovascular Disease

## 2019-10-04 ENCOUNTER — Ambulatory Visit (INDEPENDENT_AMBULATORY_CARE_PROVIDER_SITE_OTHER): Payer: Medicaid Other | Admitting: Cardiovascular Disease

## 2019-10-04 ENCOUNTER — Other Ambulatory Visit: Payer: Self-pay

## 2019-10-04 VITALS — BP 116/72 | HR 80 | Ht 72.0 in | Wt 295.4 lb

## 2019-10-04 DIAGNOSIS — I509 Heart failure, unspecified: Secondary | ICD-10-CM

## 2019-10-04 DIAGNOSIS — I5032 Chronic diastolic (congestive) heart failure: Secondary | ICD-10-CM | POA: Diagnosis not present

## 2019-10-04 DIAGNOSIS — F419 Anxiety disorder, unspecified: Secondary | ICD-10-CM

## 2019-10-04 DIAGNOSIS — E669 Obesity, unspecified: Secondary | ICD-10-CM

## 2019-10-04 NOTE — Patient Instructions (Addendum)
Medication Instructions:   If you need a refill on your cardiac medications before your next appointment, please call your pharmacy.   Lab work:  If you have labs (blood work) drawn today and your tests are completely normal, you will receive your results only by: Marland Kitchen MyChart Message (if you have MyChart) OR . A paper copy in the mail If you have any lab test that is abnormal or we need to change your treatment, we will call you to review the results.  Testing/Procedures: Your physician has requested that you have an echocardiogram. Echocardiography is a painless test that uses sound waves to create images of your heart. It provides your doctor with information about the size and shape of your heart and how well your heart's chambers and valves are working. This procedure takes approximately one hour. There are no restrictions for this procedure.  Follow-Up: At [facility], you and your health needs are our priority.  As part of our continuing mission to provide you with exceptional heart care, we have created designated Provider Care Teams.  These Care Teams include your primary Cardiologist (physician) and Advanced Practice Providers (APPs -  Physician Assistants and Nurse Practitioners) who all work together to provide you with the care you need, when you need it. You will need a follow up appointment in:  6 months.  Please call our office 2 months in advance to schedule this appointment.  You may see Mertie Moores, MD or one of the following Advanced Practice Providers on your designated Care Team: Richardson Dopp, PA-C [place], [place] . Daune Perch, NP  You have been referred to  [facility].

## 2019-10-05 DIAGNOSIS — F419 Anxiety disorder, unspecified: Secondary | ICD-10-CM | POA: Insufficient documentation

## 2019-10-05 DIAGNOSIS — I5032 Chronic diastolic (congestive) heart failure: Secondary | ICD-10-CM | POA: Insufficient documentation

## 2019-10-05 NOTE — Addendum Note (Signed)
Addended by: Thayer Headings on: 10/05/2019 06:03 PM   Modules accepted: Level of Service

## 2019-10-13 ENCOUNTER — Ambulatory Visit (HOSPITAL_COMMUNITY): Payer: Medicaid Other | Attending: Cardiology

## 2019-10-13 ENCOUNTER — Other Ambulatory Visit: Payer: Self-pay

## 2019-10-13 DIAGNOSIS — I509 Heart failure, unspecified: Secondary | ICD-10-CM | POA: Insufficient documentation

## 2019-10-14 ENCOUNTER — Telehealth: Payer: Self-pay

## 2019-10-14 DIAGNOSIS — I5022 Chronic systolic (congestive) heart failure: Secondary | ICD-10-CM

## 2019-10-14 DIAGNOSIS — I509 Heart failure, unspecified: Secondary | ICD-10-CM

## 2019-10-14 DIAGNOSIS — Z79899 Other long term (current) drug therapy: Secondary | ICD-10-CM

## 2019-10-14 MED ORDER — ENTRESTO 49-51 MG PO TABS: 1.0000 | ORAL_TABLET | Freq: Two times a day (BID) | ORAL | 3 refills | Status: DC

## 2019-10-14 NOTE — Telephone Encounter (Signed)
-----   Message from Thayer Headings, MD sent at 10/14/2019  8:02 AM EDT ----- He has severely reduced EF. Please DC Lisinopril - he will need to be off the Lisinopril for minimum of 36 hours before starting Entresto. Please start Entresto 49-51 mg PO BID after adequate washout of the Lisinopril BMP in 2-3 weeks

## 2019-10-14 NOTE — Telephone Encounter (Signed)
Pt verbalized understanding of Dr. Elmarie Shiley recommendations and will wait 36 hours prior to starting the [facility].. will return 11/02/19 for repeat labs.

## 2019-10-18 ENCOUNTER — Telehealth: Payer: Self-pay | Admitting: Cardiovascular Disease

## 2019-10-18 NOTE — Telephone Encounter (Signed)
Pt calling stating that he needs a prior auth to be able to get his entresto. I left him a 30 day free card to pick up. Jeani Hawking, LPN could you see if you can help this pt. Thanks

## 2019-10-18 NOTE — Telephone Encounter (Signed)
  Patient calling requesting [facility] [employer] [facility] [address]. Patient would like a call to know that it was called in.

## 2019-10-19 MED ORDER — ENTRESTO 49-51 MG PO TABS: 1.0000 | ORAL_TABLET | Freq: Two times a day (BID) | ORAL | 0 refills | Status: DC

## 2019-10-19 MED ORDER — ENTRESTO 49-51 MG PO TABS: 1.0000 | ORAL_TABLET | Freq: Two times a day (BID) | ORAL | 11 refills | Status: DC

## 2019-10-19 NOTE — Telephone Encounter (Signed)
I called [employer] and was advised by Sharyn Lull that the pts [employer] was transferred to  so the pt could use his 30 day free card.  [facility] and was advised that they do not know if a PA is required for the pts Entresto because they have no Ins info on file for him as [place] transferred the RX to them so he could use his free 30 day card which requires no ins info.  They confirmed that the pt did pick up his 30 day free supply of Entresto today.  I called the pt and he states that [place] advised him that a PA is required when his RX was sent to them but he feels this is wrong as he has medicaid so a PA should not be needed.  The pt states that [place] was to fax [place] a form yesterday but they have not.  I advised the pt that I will resend a RX for his Delene Loll back to [place] with a message requesting them to notify us if a PA is required which I have done and I left our fax and phone number in the message so they can reach [place].

## 2019-10-19 NOTE — Telephone Encounter (Signed)
Pt's medication was sent to pt's pharmacy as requested. Confirmation received.  °

## 2019-10-19 NOTE — Addendum Note (Signed)
**Note De-Identified Ariday Brinker Obfuscation** Addended by: Dennie Fetters on: 10/19/2019 11:31 AM   Modules accepted: Orders

## 2019-10-20 NOTE — Telephone Encounter (Signed)
We did receive an [place] PA request from [employer] for the pt. I called [employer] and did the PA over the phone with Patty.  PA Review#: [id number]  Per Patty we can call them back [phone]) in 24 hrs to get the determination.

## 2019-10-20 NOTE — Telephone Encounter (Signed)
Called patient to schedule follow-up for Entresto start on 10/23. Patient is scheduled with Richardson Dopp, PA on 11/24. He is aware to keep lab appointment for 11/11. He thanked me for the call.

## 2019-10-24 NOTE — Telephone Encounter (Signed)
**Note De-Identified Leina Babe Obfuscation** I called [employer] and was advised by Janett Billow that this Delene Loll PA has been approved from 10/20/2019 until 10/14/2020. PA Review#: [id number]

## 2019-10-25 NOTE — Telephone Encounter (Signed)
**Note De-Identified Jawan Chavarria Obfuscation** I have notified Sharyn Lull at [employer] of this [employer].

## 2019-11-02 ENCOUNTER — Other Ambulatory Visit: Payer: Medicaid Other | Admitting: *Deleted

## 2019-11-02 ENCOUNTER — Other Ambulatory Visit: Payer: Self-pay

## 2019-11-02 DIAGNOSIS — I5022 Chronic systolic (congestive) heart failure: Secondary | ICD-10-CM

## 2019-11-02 DIAGNOSIS — I509 Heart failure, unspecified: Secondary | ICD-10-CM

## 2019-11-02 DIAGNOSIS — Z79899 Other long term (current) drug therapy: Secondary | ICD-10-CM

## 2019-11-03 LAB — BASIC METABOLIC PANEL
BUN/Creatinine Ratio: 13 (ref 9–20)
BUN: 14 mg/dL (ref 6–24)
CO2: 22 mmol/L (ref 20–29)
Calcium: 9.5 mg/dL (ref 8.7–10.2)
Chloride: 101 mmol/L (ref 96–106)
Creatinine, Ser: 1.11 mg/dL (ref 0.76–1.27)
GFR calc Af Amer: 85 mL/min/{1.73_m2} (ref >59)
GFR calc non Af Amer: 73 mL/min/{1.73_m2} (ref >59)
Glucose: 231 mg/dL — ABNORMAL HIGH (ref 65–99)
Potassium: 4.1 mmol/L (ref 3.5–5.2)
Sodium: 140 mmol/L (ref 134–144)

## 2019-11-15 ENCOUNTER — Ambulatory Visit (INDEPENDENT_AMBULATORY_CARE_PROVIDER_SITE_OTHER): Payer: Medicaid Other | Admitting: Physician Assistant

## 2019-11-15 ENCOUNTER — Other Ambulatory Visit: Payer: Self-pay

## 2019-11-15 ENCOUNTER — Encounter: Payer: Self-pay | Admitting: Physician Assistant

## 2019-11-15 VITALS — BP 116/78 | HR 81 | Ht 73.0 in | Wt 295.0 lb

## 2019-11-15 DIAGNOSIS — I10 Essential (primary) hypertension: Secondary | ICD-10-CM | POA: Diagnosis not present

## 2019-11-15 DIAGNOSIS — E118 Type 2 diabetes mellitus with unspecified complications: Secondary | ICD-10-CM

## 2019-11-15 DIAGNOSIS — I5022 Chronic systolic (congestive) heart failure: Secondary | ICD-10-CM

## 2019-11-15 NOTE — Progress Notes (Signed)
Cardiology Office Note:    Date:  11/15/2019   ID:  Richard [name], DOB [birth year], MRN [id number]  PCP:  Richard Corporal, PA  Cardiologist:  Richard Miss, MD  Electrophysiologist:  None   Referring MD: Richard Corporal, PA   Chief Complaint  Patient presents with  . Follow-up    CHF    History of Present Illness:    Richard [name] is a 58 y.o. male with:   Chronic systolic CHF  Non-ischemic cardiomyopathy   Notes indicate cath in 2002 with normal coronary arteries  L-R shunt was suggested on cath in 2002 (no report in Epic)  Hx of high output HF at time of cath in 2002  EF 40-45 (2005)  EF 35 (2019)  EF 25-30 (09/2019)  Diabetes mellitus   Hypertension   OSA  Morbid obesity  Anxiety/depression   Richard [name] was seen at [facility] in 2019.  EF had previously been in the 40% range.  His EF (according to the notes) was 35%.  He then saw Dr. Elease [name] in 04/2019 via Telemedicine to re-establish.  He was last seen in 09/2019.  A follow up echocardiogram was obtained and demonstrated an EF 25-30% and mild diastolic dysfunction.  Dr. Elease [name] changed his ACE inhibitor to angiotensin receptor neprilysin inhibitor.    He returns for follow up.  He is here alone.  His sister, Richard [name], is a Charity fundraiser in our [place] clinic.  He has continued to feel well since last seen.  He has not had chest pain, shortness of breath, syncope, orthopnea, leg swelling.     Prior CV studies:   The following studies were reviewed today:  Echocardiogram 10/13/2019 EF 25-30, no LVH, grade 1 diastolic dysfunction (impaired relaxation), inferior septum, mid and apical anterior septum and mid inferior segment akinetic  Echocardiogram 2019 [facility]) Notes indicate EF = 35  Echocardiogram 05/17/2004 EF 40-45  Echocardiogram 08/19/2002 EF 40  Past Medical History:  Diagnosis Date  . Anxiety   . Depression   . Diabetes mellitus, type 2 (HCC)   . GERD (gastroesophageal reflux disease)    . HOCM (hypertrophic obstructive cardiomyopathy) (HCC)   . Hyperlipidemia   . Hypertension   . Insomnia   . Left ventricular dysfunction    EF 35%  . Personality disorder (HCC)   . PTSD (post-traumatic stress disorder)   . Sleep apnea    Surgical Hx: The patient  has a past surgical history that includes Tonsillectomy and cardiac catherization.   Current Medications: Current Meds  Medication Sig  . aspirin EC 81 MG tablet Take 81 mg by mouth daily.  Marland Kitchen atorvastatin (LIPITOR) 20 MG tablet Take 20 mg by mouth daily.  Marland Kitchen buPROPion (WELLBUTRIN XL) 300 MG 24 hr tablet Take 150 mg by mouth 2 (two) times a day.   . carvedilol (COREG) 25 MG tablet Take 25 mg by mouth 2 (two) times daily with a meal.  . dapagliflozin propanediol (FARXIGA) 10 MG TABS tablet Take 10 mg by mouth daily.  Marland Kitchen FLUoxetine (PROZAC) 40 MG capsule Take 40 mg by mouth daily.  Marland Kitchen GLIPIZIDE PO Take 10 mg by mouth 2 (two) times daily before a meal.   . METFORMIN HCL PO Take 1,000 mg by mouth 2 (two) times daily with a meal.   . Multiple Vitamin (MULTIVITAMIN) capsule Take 1 capsule by mouth daily.  Marland Kitchen omeprazole (PRILOSEC) 20 MG capsule Take 20 mg by mouth daily.  . sacubitril-valsartan (ENTRESTO) 49-51 MG Take  1 tablet by mouth 2 (two) times daily.   Current Facility-Administered Medications for the 11/15/19 encounter (Office Visit) with Tereso Newcomer T, PA-C  Medication  . 0.9 %  sodium chloride infusion     Allergies:   Patient has no known allergies.   Social History   Tobacco Use  . Smoking status: Never Smoker  . Smokeless tobacco: Never Used  Substance Use Topics  . Alcohol use: Yes    Comment: rare  . Drug use: No     Family Hx: The patient's family history includes Colon cancer in his maternal grandfather; Colon cancer (age of onset: 80) in his brother; Heart attack in an other family member; Heart disease in his father; Hyperlipidemia in his father; Hypertension in his father and mother; Stroke in his  mother. There is no history of Esophageal cancer, Rectal cancer, or Stomach cancer.  ROS:   Please see the history of present illness.    ROS All other systems reviewed and are negative.   EKGs/Labs/Other Test Reviewed:    EKG:  EKG is not  ordered today.  The ekg ordered today demonstrates n/a  Recent Labs: 11/02/2019: BUN 14; Creatinine, Ser 1.11; Potassium 4.1; Sodium 140   Recent Lipid Panel No results found for: CHOL, TRIG, HDL, CHOLHDL, LDLCALC, LDLDIRECT  Physical Exam:    VS:  BP 116/78   Pulse 81   Ht 6\' 1"  (1.854 m)   Wt 295 lb (133.8 kg)   SpO2 96%   BMI 38.92 kg/m     Wt Readings from Last 3 Encounters:  11/15/19 295 lb (133.8 kg)  10/04/19 295 lb 6.4 oz (134 kg)  05/09/19 289 lb (131.1 kg)     Physical Exam  Constitutional: He is oriented to person, place, and time. He appears well-developed and well-nourished. No distress.  HENT:  Head: Normocephalic and atraumatic.  Eyes: No scleral icterus.  Neck: No JVD present. No thyromegaly present.  Cardiovascular: Normal rate, regular rhythm and normal heart sounds.  No murmur heard. Pulmonary/Chest: Effort normal and breath sounds normal. He has no rales.  Abdominal: Soft. There is no hepatomegaly.  Musculoskeletal:        General: No edema.  Lymphadenopathy:    He has no cervical adenopathy.  Neurological: He is alert and oriented to person, place, and time.  Skin: Skin is warm and dry.  Psychiatric: He has a normal mood and affect.    ASSESSMENT & PLAN:    1. Chronic systolic CHF (congestive heart failure) (HCC) Non-ischemic cardiomyopathy.  NYHA 1-2.  EF 25-30.  Volume status stable.  He is somewhat lightheaded after taking his medications.  Therefore, I will hold off on further medication adjustments.  Continue current dose of Carvedilol, Sacubitril/Valsartan.  Obtain limited echocardiogram in Jan 2021 to recheck EF. If EF still < 35, consider adding low dose Spironolactone.   2. Essential  hypertension The patient's blood pressure is controlled on his current regimen.  Continue current therapy.    3. Type 2 diabetes mellitus with complication, without long-term current use of insulin (HCC) Continue follow up with Primary Care.  Luckily, he is already on Dapagliflozin which is beneficial in patients with HFrEF (DAPA-HF trial).    4. Morbid obesity due to excess calories [facility]) He was referred to the weight loss program at his last visit.     Dispo:  Return in about 8 weeks (around 01/10/2020) for Routine Follow Up, w/ Dr. 01/12/2020, (virtual or in-person).   Medication Adjustments/Labs and  Tests Ordered: Current medicines are reviewed at length with the patient today.  Concerns regarding medicines are outlined above.  Tests Ordered: Orders Placed This Encounter  Procedures  . ECHOCARDIOGRAM LIMITED   Medication Changes: No orders of the defined types were placed in this encounter.   Signed, Richardson Dopp, PA-C  11/15/2019 4:31 PM    [facility] [address], [place], [place]  [address] Phone: [phone]; Fax: [phone]

## 2019-11-15 NOTE — Patient Instructions (Signed)
Medication Instructions:  Your physician recommends that you continue on your current medications as directed. Please refer to the Current Medication list given to you today.  *If you need a refill on your cardiac medications before your next appointment, please call your pharmacy*  Lab Work: NONE If you have labs (blood work) drawn today and your tests are completely normal, you will receive your results only by: Marland Kitchen MyChart Message (if you have MyChart) OR . A paper copy in the mail If you have any lab test that is abnormal or we need to change your treatment, we will call you to review the results.  Testing/Procedures: Your physician has requested that you have an LIMITED echocardiogram. Echocardiography is a painless test that uses sound waves to create images of your heart. It provides your doctor with information about the size and shape of your heart and how well your heart's chambers and valves are working. This procedure takes approximately one hour. There are no restrictions for this procedure.  Follow-Up:  Your next appointment:   January 2021 AFTER ECHO  The format for your next appointment:   Either In Person or Virtual  Provider:   Mertie Moores, MD

## 2020-01-10 ENCOUNTER — Other Ambulatory Visit: Payer: Self-pay

## 2020-01-10 ENCOUNTER — Encounter: Payer: Self-pay | Admitting: Physician Assistant

## 2020-01-10 ENCOUNTER — Ambulatory Visit (HOSPITAL_COMMUNITY): Payer: Medicaid Other | Attending: Physician Assistant

## 2020-01-10 DIAGNOSIS — I5022 Chronic systolic (congestive) heart failure: Secondary | ICD-10-CM | POA: Diagnosis present

## 2020-01-10 MED ORDER — PERFLUTREN LIPID MICROSPHERE
1.0000 mL | INTRAVENOUS | Status: AC | PRN
  Administered 2020-01-10: 3 mL via INTRAVENOUS

## 2020-01-18 ENCOUNTER — Telehealth (INDEPENDENT_AMBULATORY_CARE_PROVIDER_SITE_OTHER): Payer: Medicaid Other | Admitting: Cardiovascular Disease

## 2020-01-18 ENCOUNTER — Encounter: Payer: Self-pay | Admitting: Cardiovascular Disease

## 2020-01-18 ENCOUNTER — Other Ambulatory Visit: Payer: Self-pay

## 2020-01-18 VITALS — BP 117/85 | HR 78 | Ht 73.0 in | Wt 290.0 lb

## 2020-01-18 DIAGNOSIS — I5022 Chronic systolic (congestive) heart failure: Secondary | ICD-10-CM

## 2020-01-18 DIAGNOSIS — I428 Other cardiomyopathies: Secondary | ICD-10-CM

## 2020-01-18 MED ORDER — SPIRONOLACTONE 25 MG PO TABS: 25.0000 mg | ORAL_TABLET | Freq: Every day | ORAL | 3 refills | Status: DC

## 2020-01-18 NOTE — Progress Notes (Signed)
Virtual Visit via Video Note   This visit type was conducted due to national recommendations for restrictions regarding the COVID-19 Pandemic (e.g. social distancing) in an effort to limit this patient's exposure and mitigate transmission in our community.  Due to his co-morbid illnesses, this patient is at least at moderate risk for complications without adequate follow up.  This format is felt to be most appropriate for this patient at this time.  All issues noted in this document were discussed and addressed.  A limited physical exam was performed with this format.  Please refer to the patient's chart for his consent to telehealth for Richard [name].   Date:  01/18/2020   ID:  Richard [name], DOB [id number], MRN [id number]  Patient Location: Home Provider Location: Office  PCP:  Richard Reel, PA  Cardiologist:  Richard Moores, MD  Electrophysiologist:  None   Evaluation Performed:  Follow-Up Visit  Chief Complaint:  CHF   History of Present Illness:    Richard [name] is a 59 y.o. male with CHF.  I saw Richard [name] many years ago for CHF.  Was lost to follow-up for many years.  He returned last year with persistent CHF symptoms.  Echocardiogram performed last week reveals an ejection fraction of less than 20%.  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 We have referred him to the advanced CHF clinic and his appointment is in early March.  Still asymptomatic .  Is exercising much.   Tolerating the coreg and entresto .  Admits that he hasnt previously been taking his meds exactily as prescribed . Has been very compliant for the past 2 weeks.   Before that he was only minimally compliant. ( could not remember whether or not he took the pill )   The patient does not have symptoms concerning for COVID-19 infection (fever, chills, cough, or new shortness of breath).    Past Medical History:  Diagnosis Date  . Anxiety   . Chronic systolic CHF 20/25/4270   Echocardiogram 12/2019: EF < 20, severe LVH, Gr 1 DD, trabeculations c/w LV non-compaction  . Depression   . Diabetes mellitus, type 2 ([place])   . GERD (gastroesophageal reflux disease)   . Hyperlipidemia   . Hypertension   . Hypertrophic obstructive cardiomyopathy   . Insomnia   . Left ventricular dysfunction    EF 35%  . Personality disorder ([place])   . PTSD (post-traumatic stress disorder)   . Sleep apnea    Past Surgical History:  Procedure Laterality Date  . cardiac catherization    . TONSILLECTOMY       No outpatient medications have been marked as taking for the 01/18/20 encounter (Appointment) with Richard [name], Richard Cheng, MD.   Current Facility-Administered Medications for the 01/18/20 encounter (Appointment) with Richard [name], Richard Cheng, MD  Medication  . 0.9 %  sodium chloride infusion     Allergies:   Patient has no known allergies.   Social History   Tobacco Use  . Smoking status: Never Smoker  . Smokeless tobacco: Never Used  Substance Use Topics  . Alcohol use: Yes    Comment: rare  . Drug use: No     Family Hx: The patient's family history includes Colon cancer in his maternal grandfather; Colon cancer (age of onset: 99) in his brother; Heart attack in an other family member; Heart disease in his father; Hyperlipidemia in his father; Hypertension in his father and mother; Stroke in his mother. There is no history  of Esophageal cancer, Rectal cancer, or Stomach cancer.  ROS:   Please see the history of present illness.     All other systems reviewed and are negative.   Prior CV studies:   The following studies were reviewed today:    Labs/Other Tests and Data Reviewed:    EKG:  No ECG reviewed.  Recent Labs: 11/02/2019: BUN 14; Creatinine, Ser 1.11; Potassium 4.1; Sodium 140   Recent Lipid Panel No results found for: CHOL, TRIG, HDL, CHOLHDL, LDLCALC, LDLDIRECT  Wt Readings from Last 3 Encounters:  11/15/19 295 lb (133.8 kg)  10/04/19 295 lb 6.4 oz (134 kg)   05/09/19 289 lb (131.1 kg)     Objective:    Vital Signs:  There were no vitals taken for this visit.   VITAL SIGNS:  reviewed GEN:  no acute distress EYES:  sclerae anicteric, EOMI - Extraocular Movements Intact RESPIRATORY:  normal respiratory effort, symmetric expansion CARDIOVASCULAR:  no peripheral edema SKIN:  no rash, lesions or ulcers. MUSCULOSKELETAL:  no obvious deformities. NEURO:  alert and oriented x 3, no obvious focal deficit PSYCH:  normal affect  ASSESSMENT & PLAN:    1.  CHF:  Echo showed LV noncompaction Informed me that he had a brother who died shortly after birth due to endocardial fibroelastosis.  Will ask Dr. Sidney [name] if there are some genetic testing that would be indicated.   Richard [name] also has a 40 daughter ( with no symptoms ) who may need to be screened. Marland Kitchen  He is doing well on the entresto and coreg.  Will add Spironolactone 25 mg a day  BMP in 2-3 weeks   We discussed the possibility that he may need an ICD at some point .   He will see Dr. Gala [name] in March    COVID-19 Education: The signs and symptoms of COVID-19 were discussed with the patient and how to seek care for testing (follow up with PCP or arrange E-visit).  The importance of social distancing was discussed today.  Time:   Today, I have spent  21  minutes with the patient with telehealth technology discussing the above problems.     Medication Adjustments/Labs and Tests Ordered: Current medicines are reviewed at length with the patient today.  Concerns regarding medicines are outlined above.   Tests Ordered: No orders of the defined types were placed in this encounter.   Medication Changes: No orders of the defined types were placed in this encounter.   Follow Up:  In Person in 2 month(s) with Dr. Gala [name] . I'll see him in 6 months   Signed, Richard Miss, MD  01/18/2020 7:58 AM    [facility]

## 2020-01-18 NOTE — Patient Instructions (Signed)
Medication Instructions:  1) START Spironolactone 25mg  once daily  *If you need a refill on your cardiac medications before your next appointment, please call your pharmacy*  Lab Work: BMET in 2-3 weeks  If you have labs (blood work) drawn today and your tests are completely normal, you will receive your results only by: MyChart Message (if you have MyChart) OR . A paper copy in the mail If you have any lab test that is abnormal or we need to change your treatment, we will call you to review the results.  Testing/Procedures: None  Follow-Up: At [facility], you and your health needs are our priority.  As part of our continuing mission to provide you with exceptional heart care, we have created designated Provider Care Teams.  These Care Teams include your primary Cardiologist (physician) and Advanced Practice Providers (APPs -  Physician Assistants and Nurse Practitioners) who all work together to provide you with the care you need, when you need it.  Your next appointment:   6 month(s)  The format for your next appointment:   In Person  Provider:   You may see [facility], MD or one of the following Advanced Practice Providers on your designated Care Team:    Kristeen Miss, PA-C  [place], [place]  [place], NP   Other Instructions

## 2020-01-30 ENCOUNTER — Ambulatory Visit (INDEPENDENT_AMBULATORY_CARE_PROVIDER_SITE_OTHER): Payer: Self-pay | Admitting: Bariatrics

## 2020-02-02 ENCOUNTER — Other Ambulatory Visit: Payer: Medicaid Other | Admitting: *Deleted

## 2020-02-02 ENCOUNTER — Other Ambulatory Visit: Payer: Self-pay

## 2020-02-02 DIAGNOSIS — I5022 Chronic systolic (congestive) heart failure: Secondary | ICD-10-CM

## 2020-02-03 LAB — BASIC METABOLIC PANEL
BUN/Creatinine Ratio: 17 (ref 9–20)
BUN: 18 mg/dL (ref 6–24)
CO2: 23 mmol/L (ref 20–29)
Calcium: 10 mg/dL (ref 8.7–10.2)
Chloride: 100 mmol/L (ref 96–106)
Creatinine, Ser: 1.07 mg/dL (ref 0.76–1.27)
GFR calc Af Amer: 88 mL/min/{1.73_m2} (ref >59)
GFR calc non Af Amer: 76 mL/min/{1.73_m2} (ref >59)
Glucose: 205 mg/dL — ABNORMAL HIGH (ref 65–99)
Potassium: 4.6 mmol/L (ref 3.5–5.2)
Sodium: 140 mmol/L (ref 134–144)

## 2020-02-13 ENCOUNTER — Ambulatory Visit (INDEPENDENT_AMBULATORY_CARE_PROVIDER_SITE_OTHER): Payer: Self-pay | Admitting: Bariatrics

## 2020-02-14 ENCOUNTER — Encounter (HOSPITAL_COMMUNITY): Payer: Medicaid Other | Admitting: Cardiology

## 2020-02-24 ENCOUNTER — Ambulatory Visit (HOSPITAL_COMMUNITY)
Admission: RE | Admit: 2020-02-24 | Discharge: 2020-02-24 | Disposition: A | Discharge status: Home / Self Care | Payer: Medicaid Other | Source: Ambulatory Visit | Attending: Internal Medicine | Admitting: Internal Medicine

## 2020-02-24 ENCOUNTER — Other Ambulatory Visit: Payer: Self-pay

## 2020-02-24 ENCOUNTER — Encounter (HOSPITAL_COMMUNITY): Payer: Self-pay | Admitting: Internal Medicine

## 2020-02-24 VITALS — BP 140/98 | HR 70 | Wt 283.8 lb

## 2020-02-24 DIAGNOSIS — I428 Other cardiomyopathies: Secondary | ICD-10-CM | POA: Diagnosis not present

## 2020-02-24 DIAGNOSIS — I421 Obstructive hypertrophic cardiomyopathy: Secondary | ICD-10-CM | POA: Insufficient documentation

## 2020-02-24 DIAGNOSIS — Z800 Family history of malignant neoplasm of digestive organs: Secondary | ICD-10-CM | POA: Diagnosis not present

## 2020-02-24 DIAGNOSIS — Z79899 Other long term (current) drug therapy: Secondary | ICD-10-CM | POA: Diagnosis not present

## 2020-02-24 DIAGNOSIS — Z7982 Long term (current) use of aspirin: Secondary | ICD-10-CM | POA: Insufficient documentation

## 2020-02-24 DIAGNOSIS — Z823 Family history of stroke: Secondary | ICD-10-CM | POA: Insufficient documentation

## 2020-02-24 DIAGNOSIS — F329 Major depressive disorder, single episode, unspecified: Secondary | ICD-10-CM | POA: Diagnosis not present

## 2020-02-24 DIAGNOSIS — G4733 Obstructive sleep apnea (adult) (pediatric): Secondary | ICD-10-CM | POA: Insufficient documentation

## 2020-02-24 DIAGNOSIS — I5022 Chronic systolic (congestive) heart failure: Secondary | ICD-10-CM | POA: Insufficient documentation

## 2020-02-24 DIAGNOSIS — K219 Gastro-esophageal reflux disease without esophagitis: Secondary | ICD-10-CM | POA: Diagnosis not present

## 2020-02-24 DIAGNOSIS — I5020 Unspecified systolic (congestive) heart failure: Secondary | ICD-10-CM | POA: Diagnosis present

## 2020-02-24 DIAGNOSIS — Z7984 Long term (current) use of oral hypoglycemic drugs: Secondary | ICD-10-CM | POA: Diagnosis not present

## 2020-02-24 DIAGNOSIS — F419 Anxiety disorder, unspecified: Secondary | ICD-10-CM | POA: Diagnosis not present

## 2020-02-24 DIAGNOSIS — E119 Type 2 diabetes mellitus without complications: Secondary | ICD-10-CM | POA: Diagnosis not present

## 2020-02-24 DIAGNOSIS — E785 Hyperlipidemia, unspecified: Secondary | ICD-10-CM | POA: Diagnosis not present

## 2020-02-24 DIAGNOSIS — Z6837 Body mass index (BMI) 37.0-37.9, adult: Secondary | ICD-10-CM | POA: Diagnosis not present

## 2020-02-24 DIAGNOSIS — I110 Hypertensive heart disease with heart failure: Secondary | ICD-10-CM | POA: Insufficient documentation

## 2020-02-24 DIAGNOSIS — I5032 Chronic diastolic (congestive) heart failure: Secondary | ICD-10-CM

## 2020-02-24 DIAGNOSIS — Z8249 Family history of ischemic heart disease and other diseases of the circulatory system: Secondary | ICD-10-CM | POA: Insufficient documentation

## 2020-02-24 DIAGNOSIS — Z8349 Family history of other endocrine, nutritional and metabolic diseases: Secondary | ICD-10-CM | POA: Diagnosis not present

## 2020-02-24 DIAGNOSIS — F4310 Post-traumatic stress disorder, unspecified: Secondary | ICD-10-CM | POA: Diagnosis not present

## 2020-02-24 MED ORDER — SACUBITRIL-VALSARTAN 97-103 MG PO TABS: 1.0000 | ORAL_TABLET | Freq: Two times a day (BID) | ORAL | 5 refills | Status: DC

## 2020-02-24 MED ORDER — DIAZEPAM 5 MG PO TABS: 5.0000 mg | ORAL_TABLET | Freq: Once | ORAL | 0 refills | Status: AC

## 2020-02-24 NOTE — Patient Instructions (Signed)
INCREASE Entresto 97/103mg  tab two times daily.  You have been referred to Cardiac Electrophysiology: Dr. Graciela Husbands. That office will be in contact with you in order to schedule an appointment.  You have been referred to Genetics: Dr. Jomarie Longs. That office will contact you in order to schedule an appointment.  Your physician has requested that you have a cardiac MRI. Cardiac MRI uses a computer to create images of your heart as its beating, producing both still and moving pictures of your heart and major blood vessels. For further information please visit [email or web address]. Please follow the instruction sheet given to you today for more information. Someone will contact you in order to schedule that once your insuranced has approved it.   You have been given a paper prescription for 1 tab of Valium. You are to take it the day of your procedure.  Please follow up with the [facility] in 4 months.  At the [facility], you and your health needs are our priority. As part of our continuing mission to provide you with exceptional heart care, we have created designated Provider Care Teams. These Care Teams include your primary Cardiologist (physician) and Advanced Practice Providers (APPs- Physician Assistants and Nurse Practitioners) who all work together to provide you with the care you need, when you need it.   You may see any of the following providers on your designated Care Team at your next follow up: Marland Kitchen Dr Arvilla Meres . Dr Marca Ancona . Tonye Becket, NP . Robbie Lis, PA . Karle Plumber, PharmD   Please be sure to bring in all your medications bottles to every appointment.

## 2020-02-24 NOTE — Progress Notes (Signed)
ADVANCED HF CLINIC CONSULT NOTE  Referring Physician: Dr. Elease Hashimoto Primary Cardiologist: Dr. Elease Hashimoto  HPI:  59 y/o male with h/o HTN, HL, DM, PTSD, OSA, morbid obesity and systolic HF due to LVNC. Referred by Dr. Elease Hashimoto for further evaluation of his HF.   Richard [name] is a 59 y.o. male with:   Chronic systolic CHF ? Non-ischemic cardiomyopathy  ? Notes indicate cath in 2002 with normal coronary arteries  L-R shunt was suggested on cath in 2002 (no report in Epic)  Hx of high output HF at time of cath in 2002 (unable to identify shunt) ? EF 40-45% (2005) ? EF 35% (2019) ? EF 25-30% (09/2019)  Diabetes mellitus   Hypertension   OSA  Morbid obesity  Anxiety/depression   Richard [name] has a long h/o systolic HF due to NICM. He previously saw Dr. Elease Hashimoto but was lost to f/u. In 2018 he was seen at [facility]. He then saw Dr. Al Corpus in 04/2019 via Telemedicine to re-establish.  He was last seen in 09/2019.  A follow up echocardiogram was obtained in 10/20 and demonstrated an EF 25-30% and mild diastolic dysfunction. Had repeat echo 1/21 with contrast EF 25% with deep trabecualtions concerning for LVNC.   Says he has always felt great. Says he is sedentary. Can do ADLs and walk to store without symptoms. No SOB, CP, edema, orthopea or PND. Will get a little winded if he walks fast. Wears CPAP regularly. Struggles with anxiety. No palpitations or syncope.   Dad died at 15 of SCD. Had multiple CRFs.    Review of Systems: [y] = yes, [ ]  = no   General: Weight gain [ ] ; Weight loss [ y]; Anorexia [ ] ; Fatigue [ ] ; Fever [ ] ; Chills [ ] ; Weakness [ ]   Cardiac: Chest pain/pressure [ ] ; Resting SOB [ ] ; Exertional SOB [ ] ; Orthopnea [ ] ; Pedal Edema [ ] ; Palpitations [ ] ; Syncope [ ] ; Presyncope [ ] ; Paroxysmal nocturnal dyspnea[ ]   Pulmonary: Cough [ ] ; Wheezing[ ] ; Hemoptysis[ ] ; Sputum [ ] ; Snoring [ ]   GI: Vomiting[ ] ; Dysphagia[ ] ; Melena[ ] ; Hematochezia [ ] ; Heartburn[  ]; Abdominal pain [ ] ; Constipation [ ] ; Diarrhea [ ] ; BRBPR [ ]   GU: Hematuria[ ] ; Dysuria [ ] ; Nocturia[ ]   Vascular: Pain in legs with walking [ ] ; Pain in feet with lying flat [ ] ; Non-healing sores [ ] ; Stroke [ ] ; TIA [ ] ; Slurred speech [ ] ;  Neuro: Headaches[ ] ; Vertigo[ ] ; Seizures[ ] ; Paresthesias[ ] ;Blurred vision [ ] ; Diplopia [ ] ; Vision changes [ ]   Ortho/Skin: Arthritis [ ] ; Joint pain [ ] ; Muscle pain [ ] ; Joint swelling [ ] ; Back Pain [ ] ; Rash [ ]   Psych: Depression[y]; Anxiety[y ]  Heme: Bleeding problems [ ] ; Clotting disorders [ ] ; Anemia [ ]   Endocrine: Diabetes [ y]; Thyroid dysfunction[ ]    Past Medical History:  Diagnosis Date  . Anxiety   . Chronic systolic CHF 05/09/2019   Echocardiogram 12/2019: EF < 20, severe LVH, Gr 1 DD, trabeculations c/w LV non-compaction  . Depression   . Diabetes mellitus, type 2 ([facility])   . GERD (gastroesophageal reflux disease)   . Hyperlipidemia   . Hypertension   . Hypertrophic obstructive cardiomyopathy   . Insomnia   . Left ventricular dysfunction    EF 35%  . Personality disorder ([facility])   . PTSD (post-traumatic stress disorder)   . Sleep apnea     Current Outpatient Medications  Medication Sig Dispense Refill  . aspirin EC 81 MG tablet Take 81 mg by mouth daily.    Marland Kitchen atorvastatin (LIPITOR) 20 MG tablet Take 20 mg by mouth daily.    Marland Kitchen buPROPion (WELLBUTRIN XL) 150 MG 24 hr tablet Take 150 mg by mouth 2 (two) times a day.     . carvedilol (COREG) 25 MG tablet Take 25 mg by mouth 2 (two) times daily with a meal.    . dapagliflozin propanediol (FARXIGA) 10 MG TABS tablet Take 10 mg by mouth daily.    Marland Kitchen FLUoxetine (PROZAC) 40 MG capsule Take 40 mg by mouth daily.    Marland Kitchen GLIPIZIDE PO Take 10 mg by mouth 2 (two) times daily before a meal.     . METFORMIN HCL PO Take 1,000 mg by mouth 2 (two) times daily with a meal.     . Multiple Vitamin (MULTIVITAMIN) capsule Take 1 capsule by mouth daily.    Marland Kitchen omeprazole (PRILOSEC) 20 MG  capsule Take 20 mg by mouth daily.    . sacubitril-valsartan (ENTRESTO) 49-51 MG Take 1 tablet by mouth 2 (two) times daily. 60 tablet 11  . spironolactone (ALDACTONE) 25 MG tablet Take 1 tablet (25 mg total) by mouth daily. 90 tablet 3   Current Facility-Administered Medications  Medication Dose Route Frequency Provider Last Rate Last Admin  . 0.9 %  sodium chloride infusion  500 mL Intravenous Continuous Hilarie Fredrickson, MD        No Known Allergies    Social History   Socioeconomic History  . Marital status: Legally Separated    Spouse name: Not on file  . Number of children: Not on file  . Years of education: Not on file  . Highest education level: Not on file  Occupational History  . Not on file  Tobacco Use  . Smoking status: Never Smoker  . Smokeless tobacco: Never Used  Substance and Sexual Activity  . Alcohol use: Yes    Comment: rare  . Drug use: No  . Sexual activity: Not on file  Other Topics Concern  . Not on file  Social History Narrative  . Not on file   Social Determinants of Health   Financial Resource Strain:   . Difficulty of Paying Living Expenses: Not on file  Food Insecurity:   . Worried About Programme researcher, broadcasting/film/video in the Last Year: Not on file  . Ran Out of Food in the Last Year: Not on file  Transportation Needs:   . Lack of Transportation (Medical): Not on file  . Lack of Transportation (Non-Medical): Not on file  Physical Activity:   . Days of Exercise per Week: Not on file  . Minutes of Exercise per Session: Not on file  Stress:   . Feeling of Stress : Not on file  Social Connections:   . Frequency of Communication with Friends and Family: Not on file  . Frequency of Social Gatherings with Friends and Family: Not on file  . Attends Religious Services: Not on file  . Active Member of Clubs or Organizations: Not on file  . Attends Banker Meetings: Not on file  . Marital Status: Not on file  Intimate Partner Violence:   .  Fear of Current or Ex-Partner: Not on file  . Emotionally Abused: Not on file  . Physically Abused: Not on file  . Sexually Abused: Not on file      Family History  Problem Relation Age of Onset  .  Heart attack Other   . Hypertension Father   . Heart disease Father   . Hyperlipidemia Father   . Colon cancer Brother 38  . Colon cancer Maternal Grandfather   . Hypertension Mother   . Stroke Mother   . Esophageal cancer Neg Hx   . Rectal cancer Neg Hx   . Stomach cancer Neg Hx     Vitals:   02/24/20 1152  BP: (!) 140/98  Pulse: 70  SpO2: 96%  Weight: 128.7 kg (283 lb 12.8 oz)    PHYSICAL EXAM: General:  Well appearing. No respiratory difficulty HEENT: normal Neck: supple. no JVD. Carotids 2+ bilat; no bruits. No lymphadenopathy or thryomegaly appreciated. Cor: PMI nondisplaced. Regular rate & rhythm. No rubs, gallops or murmurs. Lungs: clear Abdomen: obese soft, nontender, nondistended. No hepatosplenomegaly. No bruits or masses. Good bowel sounds. Extremities: no cyanosis, clubbing, rash, edema Neuro: alert & oriented x 3, cranial nerves grossly intact. moves all 4 extremities w/o difficulty. Affect pleasant.  ECG: NSR 85 LAD inferior qs. Personally reviewed   ASSESSMENT & PLAN:  1. Chronic systolic HF - Due to NICM. Diagnosed in 2002. Cath at that time normal coronaries - Echo 1/21 EF 25% with deep LV trabeculations concerning for LVNC - on excellent GDMT per Dr. Acie Fredrickson - NYHA I-II despite longstanding, severe LV dysfunction - volume status looks good - recent echo concerning for LVNC as above. No anginal symptoms to suggest need for repeat cath at this time. - Ideally would get cMRI but not sure he will fit in MRI tube. Will order and see - Agree with genetic evaluation and echos for 1st degree relatives - Refer to EP for ICD  2. OSA - continue CPAP  3. Morbid obesity - has been losing weight  4. HTN - BP still high - increase Entresto to  97/103  Glori Bickers, MD  12:34 PM

## 2020-03-04 DIAGNOSIS — I428 Other cardiomyopathies: Secondary | ICD-10-CM | POA: Insufficient documentation

## 2020-03-05 ENCOUNTER — Other Ambulatory Visit: Payer: Self-pay

## 2020-03-05 ENCOUNTER — Encounter: Payer: Self-pay | Admitting: Internal Medicine

## 2020-03-05 ENCOUNTER — Ambulatory Visit (INDEPENDENT_AMBULATORY_CARE_PROVIDER_SITE_OTHER): Payer: Medicaid Other | Admitting: Internal Medicine

## 2020-03-05 DIAGNOSIS — I428 Other cardiomyopathies: Secondary | ICD-10-CM

## 2020-03-05 NOTE — Progress Notes (Signed)
ELECTROPHYSIOLOGY CONSULT NOTE  Patient ID: Richard [name], MRN: [id number], DOB/AGE: 58-Nov-1962 58 y.o. Admit date: (Not on file) Date of Consult: 03/05/2020  Primary Physician: Renaldo Reel, PA Primary Cardiologist: PNa DB     Richard [name] is a 59 y.o. male who is being seen today for consideration of an ICD implantation for primary prevention at the request of Dr. Reine Just.    HPI Richard [name] is a 59 y.o. male has a long known history of a cardiomyopathy dating back almost 20 years.  He has had no clearly attributable symptoms.  It was found rather serendipitously after he presented with chest pain that turned out not to be cardiac.  Myoview scan demonstrated LV dysfunction  He was lost to follow-up for some years.  He has been seen intercurrently at [facility] couple years ago and more recently was seen again by Dr. Acie Fredrickson and Bensimhon.  He has been on aggressive therapy for his cardiomyopathy; not withstanding there has been interval worsening in ejection fraction  He has modest symptoms.  Is able to shop but is hard to carry.  Does not climb stairs.  No edema.  Nocturnal dyspnea.  ICDs have been offered in the past he has been reluctant.  He acknowledges that he struggles with generalized anxiety disorder.  Also some PTSD.  Family history of coronary disease but no known cardiomyopathy.   DATE TEST EF   2002 LHC  [place]ors-- w/o obstruction   8/03 Echo   40 %   10/20 Echo   20-25 %   1/21 Echo  <20%  trabeculations concerning for LV [place]  pending cMRI   may be limited by size   Date Cr K Hgb  2/21 1.07 4.6              Past Medical History:  Diagnosis Date  . Anxiety   . Chronic systolic CHF 78/29/5621   Echocardiogram 12/2019: EF < 20, severe LVH, Gr 1 DD, trabeculations c/w LV non-compaction  . Depression   . Diabetes mellitus, type 2 ([place])   . GERD (gastroesophageal reflux disease)   . Hyperlipidemia   . Hypertension   . Hypertrophic  obstructive cardiomyopathy   . Insomnia   . Left ventricular dysfunction    EF 35%  . Personality disorder ([place])   . PTSD (post-traumatic stress disorder)   . Sleep apnea       Surgical History:  Past Surgical History:  Procedure Laterality Date  . cardiac catherization    . TONSILLECTOMY       Home Meds: Current Meds  Medication Sig  . aspirin EC 81 MG tablet Take 81 mg by mouth daily.  Marland Kitchen atorvastatin (LIPITOR) 20 MG tablet Take 20 mg by mouth daily.  Marland Kitchen buPROPion (WELLBUTRIN XL) 150 MG 24 hr tablet Take 150 mg by mouth 2 (two) times a day.   . carvedilol (COREG) 25 MG tablet Take 25 mg by mouth 2 (two) times daily with a meal.  . dapagliflozin propanediol (FARXIGA) 10 MG TABS tablet Take 10 mg by mouth daily.  Marland Kitchen FLUoxetine (PROZAC) 40 MG capsule Take 40 mg by mouth daily.  Marland Kitchen GLIPIZIDE PO Take 10 mg by mouth 2 (two) times daily before a meal.   . METFORMIN HCL PO Take 1,000 mg by mouth 2 (two) times daily with a meal.   . Multiple Vitamin (MULTIVITAMIN) capsule Take 1 capsule by mouth daily.  Marland Kitchen omeprazole (PRILOSEC) 20 MG capsule Take  20 mg by mouth daily.  . sacubitril-valsartan (ENTRESTO) 97-103 MG Take 1 tablet by mouth 2 (two) times daily.  Marland Kitchen spironolactone (ALDACTONE) 25 MG tablet Take 1 tablet (25 mg total) by mouth daily.   Current Facility-Administered Medications for the 03/05/20 encounter (Office Visit) with Duke Salvia, MD  Medication  . 0.9 %  sodium chloride infusion    Allergies: No Known Allergies  Social History   Socioeconomic History  . Marital status: Legally Separated    Spouse name: Not on file  . Number of children: Not on file  . Years of education: Not on file  . Highest education level: Not on file  Occupational History  . Not on file  Tobacco Use  . Smoking status: Never Smoker  . Smokeless tobacco: Never Used  Substance and Sexual Activity  . Alcohol use: Yes    Comment: rare  . Drug use: No  . Sexual activity: Not on file  Other  Topics Concern  . Not on file  Social History Narrative  . Not on file   Social Determinants of Health   Financial Resource Strain:   . Difficulty of Paying Living Expenses:   Food Insecurity:   . Worried About Programme researcher, broadcasting/film/video in the Last Year:   . Barista in the Last Year:   Transportation Needs:   . Freight forwarder (Medical):   Marland Kitchen Lack of Transportation (Non-Medical):   Physical Activity:   . Days of Exercise per Week:   . Minutes of Exercise per Session:   Stress:   . Feeling of Stress :   Social Connections:   . Frequency of Communication with Friends and Family:   . Frequency of Social Gatherings with Friends and Family:   . Attends Religious Services:   . Active Member of Clubs or Organizations:   . Attends Banker Meetings:   Marland Kitchen Marital Status:   Intimate Partner Violence:   . Fear of Current or Ex-Partner:   . Emotionally Abused:   Marland Kitchen Physically Abused:   . Sexually Abused:      Family History  Problem Relation Age of Onset  . Heart attack Other   . Hypertension Father   . Heart disease Father   . Hyperlipidemia Father   . Colon cancer Brother 64  . Colon cancer Maternal Grandfather   . Hypertension Mother   . Stroke Mother   . Esophageal cancer Neg Hx   . Rectal cancer Neg Hx   . Stomach cancer Neg Hx      ROS:  Please see the history of present illness.     All other systems reviewed and negative.    Physical Exam: Blood pressure 124/78, pulse 78, height 6\' 1"  (1.854 m), weight 282 lb (127.9 kg), SpO2 97 %. General: Well developed, Morbidly obese  male in no acute distress. Head: Normocephalic, atraumatic, sclera non-icteric, no xanthomas, nares are without discharge. EENT: normal  Lymph Nodes:  none Neck: Negative for carotid bruits. JVD not elevated. Back:without scoliosis kyphosis Lungs: Clear bilaterally to auscultation without wheezes, rales, or rhonchi. Breathing is unlabored. Heart: RRR with S1 S2. No  murmur .  No rubs, or gallops appreciated. Abdomen: Soft, non-tender, non-distended with normoactive bowel sounds. No hepatomegaly. No rebound/guarding. No obvious abdominal masses. Msk:  Strength and tone appear normal for age. Extremities: No clubbing or cyanosis. No  edema.  Distal pedal pulses are 2+ and equal bilaterally. Skin: Warm and Dry Neuro: Alert  and oriented X 3. CN III-XII intact Grossly normal sensory and motor function . Psych:  Responds to questions appropriately with a normal affect.      Labs: Cardiac Enzymes No results for input(s): CKTOTAL, CKMB, TROPONINI in the last 72 hours. CBC Lab Results  Component Value Date   HGB 17.3 (H) 01/07/2014   HCT 51.0 01/07/2014   PROTIME: No results for input(s): LABPROT, INR in the last 72 hours. Chemistry No results for input(s): NA, K, CL, CO2, BUN, CREATININE, CALCIUM, PROT, BILITOT, ALKPHOS, ALT, AST, GLUCOSE in the last 168 hours.  Invalid input(s): LABALBU Lipids No results found for: CHOL, HDL, LDLCALC, TRIG BNP No results found for: PROBNP Thyroid Function Tests: No results for input(s): TSH, T4TOTAL, T3FREE, THYROIDAB in the last 72 hours.  Invalid input(s): FREET3 Miscellaneous No results found for: DDIMER  Radiology/Studies:  No results found.  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  It is appropriate to consider ICD implantation for primary prevention.  We have discussed its risks and benefits, albeit in the latter case we do not have newer data in the  context of Entresto and SGLT2 inhibitors. Still, it is reasonable, particularly given the disabling nature of his anxiety, to pursue ICD implantation for sudden cardiac death prevention specially given his young age.  This is true not withstanding the long history of his cardiomyopathy.  He has been ambivalent about this for some time.  This was recommended to him at [facility] in 2018.  We will plan to follow-up with him following his MRI next month; at this juncture he is inclined towards proceeding.  Low voltage but there is discordance in the echo reports.  10/20-"no left ventricular hypertrophy"---1/21 "severe left ventricular hypertrophy"   MRI will be helpful.  If indeed he has left ventricular hypertrophy in the context of low voltage, amyloid would be unlikely given the longstanding presentation of his disease but some kind of other infiltrative myopathy would be important to consider.   Sherryl Manges

## 2020-03-05 NOTE — Patient Instructions (Signed)
Medication Instructions:  Your physician recommends that you continue on your current medications as directed. Please refer to the Current Medication list given to you today.  Labwork: None ordered.  Testing/Procedures: None ordered.  Follow-Up:  Telehealth visit 05/03/2020 at 145pm Your physician wants you to follow-up in: You will receive a reminder letter in the mail two months in advance. If you don't receive a letter, please call our office to schedule the follow-up appointment.

## 2020-04-12 ENCOUNTER — Ambulatory Visit: Payer: Medicaid Other | Admitting: Genetic Counselor

## 2020-04-12 ENCOUNTER — Other Ambulatory Visit: Payer: Self-pay

## 2020-04-16 ENCOUNTER — Telehealth (HOSPITAL_COMMUNITY): Payer: Self-pay | Admitting: Emergency Medicine

## 2020-04-16 NOTE — Telephone Encounter (Signed)
Reaching out to patient to offer assistance regarding upcoming cardiac imaging study; pt verbalizes understanding of appt date/time, parking situation and where to check in, and verified current allergies; name and call back number provided for further questions should they arise Rockwell Alexandria RN Navigator Cardiac Imaging [facility] [phone] office [phone] cell  Pt denies metal implants other than dental fillings, some claustro- has rx for 5mg  valium. Pt stated he did not want to take but I told him he would be in machine at least 45 mins, and encouraged a ride home if he decided to take the med. Pt appreciated the information

## 2020-04-17 ENCOUNTER — Other Ambulatory Visit: Payer: Self-pay

## 2020-04-17 ENCOUNTER — Ambulatory Visit (HOSPITAL_COMMUNITY)
Admission: RE | Admit: 2020-04-17 | Discharge: 2020-04-17 | Disposition: A | Discharge status: Home / Self Care | Payer: Medicaid Other | Source: Ambulatory Visit | Attending: Internal Medicine | Admitting: Internal Medicine

## 2020-04-17 DIAGNOSIS — I5032 Chronic diastolic (congestive) heart failure: Secondary | ICD-10-CM

## 2020-04-17 MED ORDER — GADOBUTROL 1 MMOL/ML IV SOLN
10.0000 mL | Freq: Once | INTRAVENOUS | Status: AC | PRN
  Administered 2020-04-17: 10 mL via INTRAVENOUS

## 2020-04-20 NOTE — Progress Notes (Addendum)
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 He reports being diagnosed with LVNC two months ago. He states that for the last 19 years he has NICM but is asymptomatic even though he has reduced EF. He tells me that in 2002 his wife was pregnant and very anxious during this time. One night he woke up thinking he had a cardiac event that turned out to be GERD. He reports having a normal stress test and heart cath. His cardiologist suspected that he had an AVM and was treated by medication at that time. He states that he was under observation for the next few years and had no functional limitation. In Jan 2021 he had an echo with contrast that showed severe LVH with LVNC. He was referred to Dr. Gala Romney for a second opinion and is awaiting EP studies. Currently, he reports no symptoms or functional limitations.  Family history Richard [name] (III.2) has a healthy 12 year-old daughter (IV.4). His older brother (III.1) age 25 and two sisters, ages 72 and 7 (III.3, III.4) are also in good health as are their children (IV.1-IV.3, IV.5-IV.8). His youngest brother (III.5) died 77 weeks of age- autopsy indicated endocardial fibroelastosis as cause of death. He tells me tht his brother had pneumonia at the time of his death.  Richard [name]'s father (II.2) died when the lawn mower he was driving rolled over an embankment and broke his neck. His older brother (II.1) lived to 70 as did his mother (I.2). Paternal grandfather (I.1) died at 51 in a coal mine accident.   Richard [name]'s mother (II.3) had several strokes and eventually died of one at age 65. Her father  (I.3) died of cancer at 65 and mother (I.4) of a  retroperitoneal bleed while undergoing a heart cath at age 15.    Genetics I informed Richard [name] him that isolated left ventricular noncompaction is considered a genetic cardiomyopathy. While it can be associated with other conditions such as Barth syndrome, mitochondrial disorders and myotonic dystrophy, or concomitantly present with hypertrophic and dilated cardiomyopathy, the major genetic cause for familial noncompaction has yet to be identified. I explained to him that there is extreme variability of the LVNC morphological spectrum and that it may also occur as an anatomic variant of left ventricular structure instead of a type of heart disease. He verbalized understanding of this.   I discussed the genetics of LVNC and explained that depending upon the underlying cause it may have X-linked or autosomal inheritance. We walked through the process of genetic testing. Some studies have reported variants in sarcomeric genes in 29% to 41% of adult patients with LVNC.   Impression  In summary, Richard [name] presents with LVNC at age 89 and is asymptomatic. He is concerned that his daughter may be at risk of having his condition. In the absence of a family history of sudden death or cardiomyopathy, it is likely that his LVNC is a sporadic anatomic variant and not inherited. At this time genetic testing for LVNC does not have clinical utility. He verbalized understanding of this.  Please note that the patient has not been counseled in this visit on personal, cultural or ethical issues that he may face due to his heart condition.     Sidney Ace, Ph.D,  [facility] Clinical Molecular Geneticist

## 2020-05-02 NOTE — Progress Notes (Signed)
Electrophysiology TeleHealth Note   Due to national recommendations of social distancing due to COVID 19, an audio/video telehealth visit is felt to be most appropriate for this patient at this time.  See MyChart message from today for the patient's consent to telehealth for [facility].   Date:  05/03/2020   ID:  Richard [name], DOB [birth year], MRN [id number]  Location: patient's home  Provider location: [address] [place]  Evaluation Performed: Follow-up visit  PCP:  Renaldo Reel, PA  Cardiologist:   DB/PNa Electrophysiologist:  SK   Chief Complaint:LV noncompaction   History of Present Illness:    Richard [name] is a 59 y.o. male who presents via audio/video conferencing for a telehealth visit today.  Since last being seen in our clinic for consideration of an ICD  the patient reports increasing acceptance of the idea of an ICD--  The patient denies chest pain, shortness of breath, nocturnal dyspnea, orthopnea or peripheral edema.  There have been no palpitations, lightheadedness or syncope.   Genetic counseling report reviewed, no utility of genetic testing for LVNC, and no evidence of inheritance    Family history of coronary disease but no known cardiomyopathy.   DATE TEST EF   2002 LHC  Cors-- w/o obstruction   8/03 Echo   40 %   10/20 Echo   20-25 %   1/21 Echo  <20%  trabeculations concerning for LV [place]  4/21 cMRI 30%  concerning for LV [place] ( trab/non trab ratio 4.6:1)   Date Cr K Hgb  2/21 1.07 4.6             The patient denies symptoms of fevers, chills, cough, or new SOB worrisome for COVID 19.    Past Medical History:  Diagnosis Date  . Anxiety   . Chronic systolic CHF 25/85/2778   Echocardiogram 12/2019: EF < 20, severe LVH, Gr 1 DD, trabeculations c/w LV non-compaction  . Depression   . Diabetes mellitus, type 2 ([place])   . GERD (gastroesophageal reflux disease)   . Hyperlipidemia   . Hypertension   .  Hypertrophic obstructive cardiomyopathy   . Insomnia   . Left ventricular dysfunction    EF 35%  . Personality disorder ([place])   . PTSD (post-traumatic stress disorder)   . Sleep apnea     Past Surgical History:  Procedure Laterality Date  . cardiac catherization    . TONSILLECTOMY      Current Outpatient Medications  Medication Sig Dispense Refill  . aspirin EC 81 MG tablet Take 81 mg by mouth daily.    Marland Kitchen atorvastatin (LIPITOR) 20 MG tablet Take 20 mg by mouth daily.    Marland Kitchen buPROPion (WELLBUTRIN XL) 150 MG 24 hr tablet Take 150 mg by mouth 2 (two) times a day.     . carvedilol (COREG) 25 MG tablet Take 25 mg by mouth 2 (two) times daily with a meal.    . dapagliflozin propanediol (FARXIGA) 10 MG TABS tablet Take 10 mg by mouth daily.    Marland Kitchen FLUoxetine (PROZAC) 40 MG capsule Take 40 mg by mouth daily.    Marland Kitchen GLIPIZIDE PO Take 10 mg by mouth 2 (two) times daily before a meal.     . METFORMIN HCL PO Take 1,000 mg by mouth 2 (two) times daily with a meal.     . Multiple Vitamin (MULTIVITAMIN) capsule Take 1 capsule by mouth daily.    Marland Kitchen omeprazole (PRILOSEC) 20 MG  capsule Take 20 mg by mouth daily.    . sacubitril-valsartan (ENTRESTO) 97-103 MG Take 1 tablet by mouth 2 (two) times daily. 60 tablet 5  . spironolactone (ALDACTONE) 25 MG tablet Take 1 tablet (25 mg total) by mouth daily. 90 tablet 3   Current Facility-Administered Medications  Medication Dose Route Frequency Provider Last Rate Last Admin  . 0.9 %  sodium chloride infusion  500 mL Intravenous Continuous Hilarie Fredrickson, MD        Allergies:   Patient has no known allergies.   Social History:  The patient  reports that he has never smoked. He has never used smokeless tobacco. He reports current alcohol use. He reports that he does not use drugs.   Family History:  The patient's   family history includes Colon cancer in his maternal grandfather; Colon cancer (age of onset: 37) in his brother; Heart attack in an other family  member; Heart disease in his father; Hyperlipidemia in his father; Hypertension in his father and mother; Stroke in his mother.   ROS:  Please see the history of present illness.   All other systems are personally reviewed and negative.    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  The patient express understanding  and are willing to proceed.    OSA so will plan to do implant with CPAP   Continue med Rx   Free to use whatever psychotropic meds felt to be optimal    COVID 19 screen The patient denies symptoms of COVID 19 at this time.  The importance of social distancing was discussed today.  Follow-up: post implant    Current medicines are reviewed at length with the patient today.   The patient does not have concerns regarding his medicines.  The following changes were made today:  none  Labs/ tests ordered today include:   No orders of the defined types were placed in this encounter.   Future tests ( post COVID )      Patient Risk:  after full review of this patients clinical status, I feel that they are at moderate  risk at this time.  Today, I have spent 14  minutes with the patient with telehealth technology discussing the above.  Signed, Sherryl Manges, MD  05/03/2020 2:20 PM     [facility] [address] [place] [address] [phone] (office) [phone] (fax)

## 2020-05-03 ENCOUNTER — Other Ambulatory Visit: Payer: Self-pay

## 2020-05-03 ENCOUNTER — Telehealth (INDEPENDENT_AMBULATORY_CARE_PROVIDER_SITE_OTHER): Payer: Medicaid Other | Admitting: Internal Medicine

## 2020-05-03 ENCOUNTER — Telehealth: Payer: Self-pay

## 2020-05-03 VITALS — BP 118/85 | HR 72 | Wt 285.0 lb

## 2020-05-03 DIAGNOSIS — I428 Other cardiomyopathies: Secondary | ICD-10-CM | POA: Diagnosis not present

## 2020-05-03 DIAGNOSIS — I517 Cardiomegaly: Secondary | ICD-10-CM

## 2020-05-03 DIAGNOSIS — I5022 Chronic systolic (congestive) heart failure: Secondary | ICD-10-CM

## 2020-05-03 DIAGNOSIS — Z01812 Encounter for preprocedural laboratory examination: Secondary | ICD-10-CM | POA: Diagnosis not present

## 2020-05-03 NOTE — Telephone Encounter (Signed)
  Patient Consent for Virtual Visit         Richard [name] has provided verbal consent on 05/03/2020 for a virtual visit (video or telephone).   CONSENT FOR VIRTUAL VISIT FOR:  Richard [name]  By participating in this virtual visit I agree to the following:  I hereby voluntarily request, consent and authorize [facility] and its employed or contracted physicians, physician assistants, nurse practitioners or other licensed health care professionals (the Practitioner), to provide me with telemedicine health care services (the "Services") as deemed necessary by the treating Practitioner. I acknowledge and consent to receive the Services by the Practitioner via telemedicine. I understand that the telemedicine visit will involve communicating with the Practitioner through live audiovisual communication technology and the disclosure of certain medical information by electronic transmission. I acknowledge that I have been given the opportunity to request an in-person assessment or other available alternative prior to the telemedicine visit and am voluntarily participating in the telemedicine visit.  I understand that I have the right to withhold or withdraw my consent to the use of telemedicine in the course of my care at any time, without affecting my right to future care or treatment, and that the Practitioner or I may terminate the telemedicine visit at any time. I understand that I have the right to inspect all information obtained and/or recorded in the course of the telemedicine visit and may receive copies of available information for a reasonable fee.  I understand that some of the potential risks of receiving the Services via telemedicine include:  Marland Kitchen Delay or interruption in medical evaluation due to technological equipment failure or disruption; . Information transmitted may not be sufficient (e.g. poor resolution of images) to allow for appropriate medical decision making by the  Practitioner; and/or  . In rare instances, security protocols could fail, causing a breach of personal health information.  Furthermore, I acknowledge that it is my responsibility to provide information about my medical history, conditions and care that is complete and accurate to the best of my ability. I acknowledge that Practitioner's advice, recommendations, and/or decision may be based on factors not within their control, such as incomplete or inaccurate data provided by me or distortions of diagnostic images or specimens that may result from electronic transmissions. I understand that the practice of medicine is not an exact science and that Practitioner makes no warranties or guarantees regarding treatment outcomes. I acknowledge that a copy of this consent can be made available to me via my patient portal [facility] MyChart), or I can request a printed copy by calling the office of [facility].    I understand that my insurance will be billed for this visit.   I have read or had this consent read to me. . I understand the contents of this consent, which adequately explains the benefits and risks of the Services being provided via telemedicine.  . I have been provided ample opportunity to ask questions regarding this consent and the Services and have had my questions answered to my satisfaction. . I give my informed consent for the services to be provided through the use of telemedicine in my medical care

## 2020-05-14 ENCOUNTER — Telehealth: Payer: Self-pay

## 2020-05-14 NOTE — Addendum Note (Signed)
Addended by: Alois Cliche on: 05/14/2020 09:54 AM   Modules accepted: Orders

## 2020-05-14 NOTE — Telephone Encounter (Signed)
Attempted phone call to pt.  Left voicemail message to contact RN at [phone]. 

## 2020-05-20 IMAGING — MR MR CARD MORPHOLOGY WO/W CM
43 of 45 series · 43 of 45 positions shown · IV contrast (gadavist)
Comparison: none

CLINICAL DATA: 59-year-old male with h/o non-ischemic
cardiomyopathy of unknown etiology.

EXAM:
CARDIAC MRI
TECHNIQUE: The patient was scanned on a 1.5 Tesla GE magnet. A dedicated
cardiac coil was used. Functional imaging was done using Fiesta
sequences. [DATE], and 4 chamber views were done to assess for RWMA's.
Modified Csernai rule using a short axis stack was used to
calculate an ejection fraction on a dedicated work station using
Circle software. The patient received 8 cc of Gadavist. After 10
minutes inversion recovery sequences were used to assess for
infiltration and scar tissue.
CONTRAST:  8 cc  of Gadavist

[Series 9: bSSFP · oblique · 8.0mm · 1.70mm/px · 1 of 25 slices shown (1 of 20)]
[im 1/25]
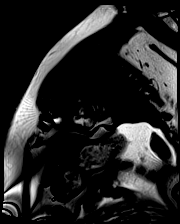

[Series 9: bSSFP · oblique · 8.0mm · 1.70mm/px · 1 of 25 slices shown (2 of 20)]
[im 1/25]
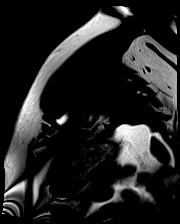

[Series 9: bSSFP · oblique · 8.0mm · 1.70mm/px · 1 of 25 slices shown (3 of 20)]
[im 1/25]
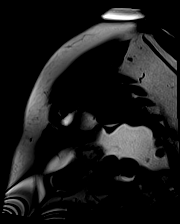

[Series 9: bSSFP · oblique · 8.0mm · 1.70mm/px · 1 of 25 slices shown (4 of 20)]
[im 1/25]
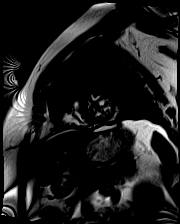

[Series 9: bSSFP · oblique · 8.0mm · 1.70mm/px · 1 of 25 slices shown (5 of 20)]
[im 1/25]
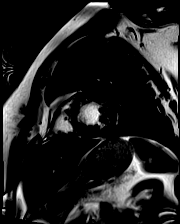

[Series 9: bSSFP · oblique · 8.0mm · 1.70mm/px · 1 of 25 slices shown (6 of 20)]
[im 1/25]
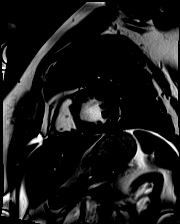

[Series 9: bSSFP · oblique · 8.0mm · 1.70mm/px · 1 of 25 slices shown (7 of 20)]
[im 1/25]
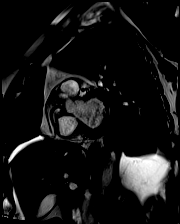

[Series 9: bSSFP · oblique · 8.0mm · 1.70mm/px · 1 of 25 slices shown (8 of 20)]
[im 1/25]
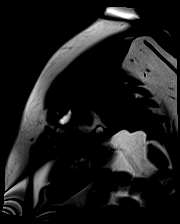

[Series 9: bSSFP · oblique · 8.0mm · 1.70mm/px · 1 of 25 slices shown (9 of 20)]
[im 1/25]
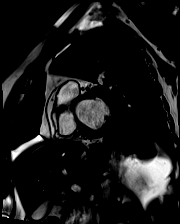

[Series 9: bSSFP · oblique · 8.0mm · 1.70mm/px · 1 of 25 slices shown (10 of 20)]
[im 1/25]
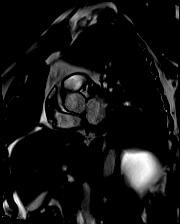

[Series 9: bSSFP · oblique · 8.0mm · 1.70mm/px · 1 of 25 slices shown (11 of 20)]
[im 1/25]
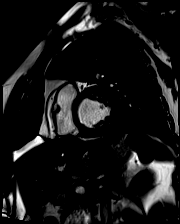

[Series 9: bSSFP · oblique · 8.0mm · 1.70mm/px · 1 of 25 slices shown (12 of 20)]
[im 1/25]
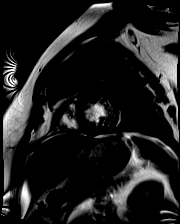

[Series 9: bSSFP · oblique · 8.0mm · 1.70mm/px · 1 of 25 slices shown (13 of 20)]
[im 1/25]
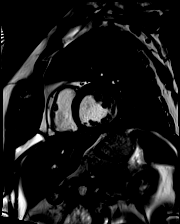

[Series 9: bSSFP · oblique · 8.0mm · 1.70mm/px · 1 of 25 slices shown (14 of 20)]
[im 1/25]
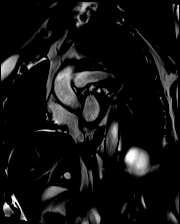

[Series 9: bSSFP · oblique · 8.0mm · 1.70mm/px · 1 of 25 slices shown (15 of 20)]
[im 1/25]
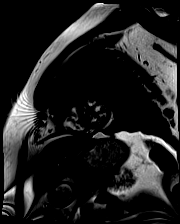

[Series 9: bSSFP · oblique · 8.0mm · 1.70mm/px · 1 of 25 slices shown (16 of 20)]
[im 1/25]
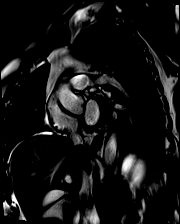

[Series 9: bSSFP · oblique · 8.0mm · 1.70mm/px · 1 of 25 slices shown (17 of 20)]
[im 1/25]
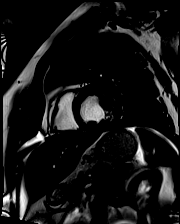

[Series 10: (id)_long_t1 · oblique · 8.0mm · 1.48mm/px · 1 of 24 slices shown]
[im 1/24]
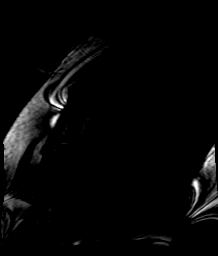

[Series 11: (id)_long_t1_moco · oblique · 8.0mm · 1.48mm/px · 1 of 24 slices shown]
[im 1/24]
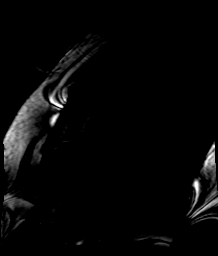

[Series 12: (id)_long_t1_moco_t1 · oblique · 8.0mm · 1.48mm/px · 1 of 6 slices shown]
[im 1/6]
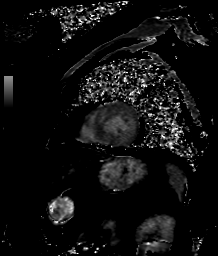

[Series 14: (id)_trufi · axial · 8.0mm · 1.98mm/px · 1 of 12 slices shown]
[im 1/12]
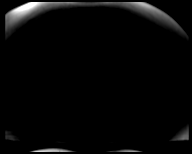

[Series 15: (id)_trufi_moco · axial · 8.0mm · 1.98mm/px · 1 of 12 slices shown]
[im 1/12]
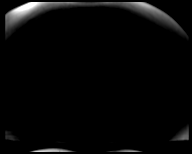

[Series 16: (id)_trufi_moco_t2 · axial · 8.0mm · 1.98mm/px · 1 of 4 slices shown]
[im 1/4]
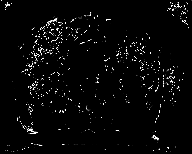

[Series 18: bSSFP · axial · 6.0mm · 1.41mm/px · 1 of 25 slices shown (18 of 20)]
[im 1/25]
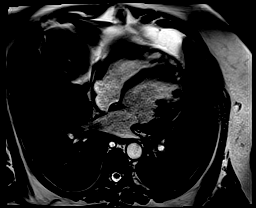

[Series 19: bSSFP · coronal · 6.0mm · 1.41mm/px · 1 of 25 slices shown (19 of 20)]
[im 1/25]
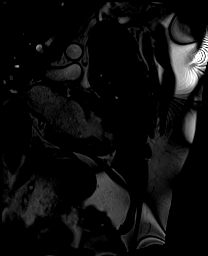

[Series 20: bSSFP · oblique · 6.0mm · 1.41mm/px · 1 of 25 slices shown (20 of 20)]
[im 1/25]
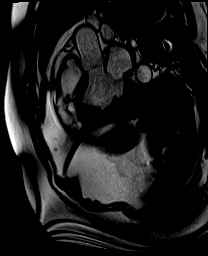

[Series 21: t2_stir_db_radial ((date)ch) · axial · 6.0mm · 1.73mm/px · 1 of 3 slices shown]
[im 1/3]
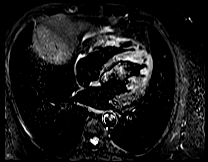

[Series 24: lge_single shot sa · oblique · 8.0mm · 1.98mm/px · 1 of 17 slices shown (1 of 4)]
[im 1/17]
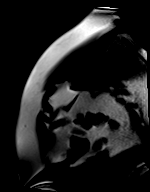

[Series 25: lge_single shot sa · oblique · 8.0mm · 1.98mm/px · 1 of 17 slices shown (2 of 4)]
[im 1/17]
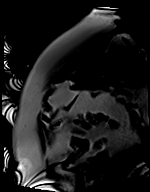

[Series 26: lge_single shot radial_mag · axial · 6.0mm · 1.98mm/px · 1 of 1 slices shown]
[im 1/1]
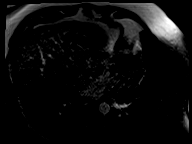

[Series 27: lge_single shot radial_psir · axial · 6.0mm · 1.98mm/px · 1 of 1 slices shown]
[im 1/1]
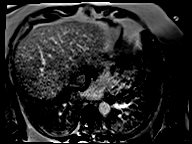

[Series 33: lge_single shot sa · oblique · 8.0mm · 1.98mm/px · 1 of 17 slices shown (3 of 4)]
[im 1/17]
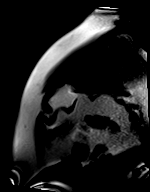

[Series 34: lge_single shot sa · oblique · 8.0mm · 1.98mm/px · 1 of 17 slices shown (4 of 4)]
[im 1/17]
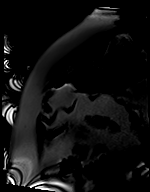

[Series 35: (id)_short_t1 · axial · 8.0mm · 1.41mm/px · 1 of 36 slices shown (1 of 2)]
[im 1/36]
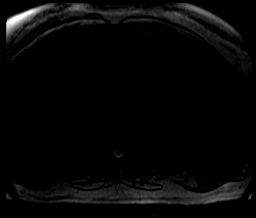

[Series 36: (id)_short_t1_moco · axial · 8.0mm · 1.41mm/px · 1 of 36 slices shown (1 of 2)]
[im 1/36]
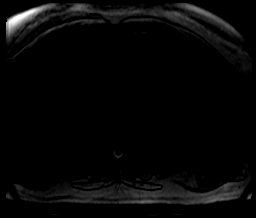

[Series 37: (id)_short_t1_moco_t1 · axial · 8.0mm · 1.41mm/px · 1 of 8 slices shown (1 of 2)]
[im 1/8]
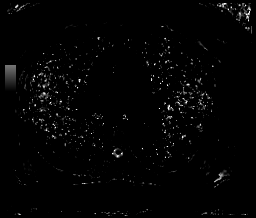

[Series 40: lge short axis_mag · oblique · 8.0mm · 1.70mm/px · 1 of 17 slices shown]
[im 1/17]
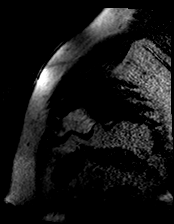

[Series 41: lge short axis_psir · oblique · 8.0mm · 1.70mm/px · 1 of 17 slices shown]
[im 1/17]
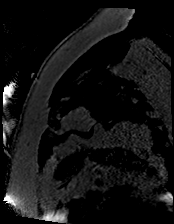

[Series 42: (id)_short_t1 · oblique · 8.0mm · 1.41mm/px · 1 of 27 slices shown (2 of 2)]
[im 1/27]
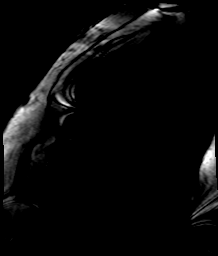

[Series 43: (id)_short_t1_moco · oblique · 8.0mm · 1.41mm/px · 1 of 27 slices shown (2 of 2)]
[im 1/27]
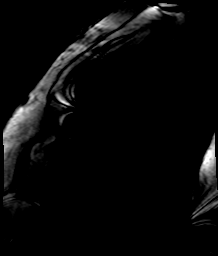

[Series 44: (id)_short_t1_moco_t1 · oblique · 8.0mm · 1.41mm/px · 1 of 6 slices shown (2 of 2)]
[im 1/6]
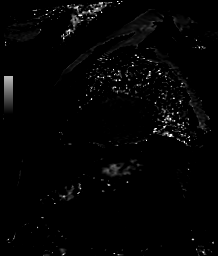

[Series 46: lge radial ((date)ch)_mag · axial · 6.0mm · 1.61mm/px · 1 of 1 slices shown]
[im 1/1]
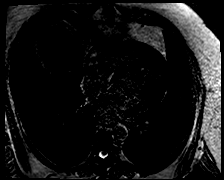

[Series 47: lge radial ((date)ch)_psir · axial · 6.0mm · 1.61mm/px · 1 of 1 slices shown]
[im 1/1]
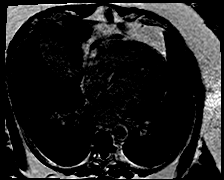

[43 of 45 positions shown; findings below may reference images not displayed]

FINDINGS: 1. Normal left ventricular size with moderate left ventricular
hypertrophy and severe systolic dysfunction (LVEF = 30%). There is
diffuse hypokinesis with paradoxical septal motion. Transmural late
gadolinium enhancement is seen in the basal inferoseptal and
inferior walls suspicious for a small focal infarct. There are
severe hyper-trabeculations in the mid and apical segments with
ratio of trabeculated to non-trabeculated myocardium [DATE].

LVEDD: 48 mm

LVESD: 43 mm

LVEDV: 183 ml

LVESV: 128 mm

SV: 54 ml

CO: 4.6 L/min

Myocardial mass: 191 g

2. Normal right ventricular size, thickness and mildly decreased
systolic function (LVEF = 41%). There are no regional wall motion
abnormalities. RV myocardium is also hypertrabeculated.

3.  Normal left and right atrial size.

4. Normal size of the aortic root, ascending aorta and pulmonary
artery.

5.  Trivial mitral and mild tricuspid regurgitation.

6.  Normal pericardium.  No pericardial effusion.
IMPRESSION: 1. Normal left ventricular size with moderate left ventricular
hypertrophy and severe systolic dysfunction (LVEF = 30%). There is
diffuse hypokinesis with paradoxical septal motion. Transmural late
gadolinium enhancement is seen in the basal inferoseptal and
inferior walls suspicious for a small focal infarct. There are
severe hyper-trabeculations in the mid and apical segments with
ratio of trabeculated to non-trabeculated myocardium [DATE].

2. Normal right ventricular size, thickness and mildly decreased
systolic function (LVEF = 41%). There are no regional wall motion
abnormalities. RV myocardium is also hypertrabeculated.

3. Trivial mitral and mild tricuspid regurgitation.

These findings are consistent with non-compaction cardiomyopathy
with severe LV and mild RV dysfunction. There is also possible small
focal prior infarct in the basal inferior wall.

## 2020-05-24 ENCOUNTER — Telehealth: Payer: Self-pay

## 2020-05-24 NOTE — Telephone Encounter (Signed)
Attempted phone call to pt.  Left voicemail message to contact RN at [phone] to review instructions re: ICD implantation scheduled for 06/13/2020.

## 2020-06-06 NOTE — Telephone Encounter (Signed)
Darrik is returning [employer]. Please advise.

## 2020-06-06 NOTE — Telephone Encounter (Signed)
Lm to call back ./cy 

## 2020-06-06 NOTE — Telephone Encounter (Signed)
Spoke with pt and needed instructions reviewed and while reviewing pt mentioned will return for lab work.Pt made aware will print instructions and leave copy at front desk along with antibacterial scrub for pick up  Since has to return prior to procedure ./cy

## 2020-06-11 ENCOUNTER — Telehealth: Payer: Self-pay | Admitting: Internal Medicine

## 2020-06-11 ENCOUNTER — Other Ambulatory Visit: Payer: Medicaid Other | Admitting: *Deleted

## 2020-06-11 ENCOUNTER — Inpatient Hospital Stay (HOSPITAL_COMMUNITY): Admission: RE | Admit: 2020-06-11 | Payer: Medicaid Other | Source: Ambulatory Visit

## 2020-06-11 ENCOUNTER — Other Ambulatory Visit: Payer: Self-pay

## 2020-06-11 DIAGNOSIS — Z01812 Encounter for preprocedural laboratory examination: Secondary | ICD-10-CM

## 2020-06-11 DIAGNOSIS — I517 Cardiomegaly: Secondary | ICD-10-CM

## 2020-06-11 DIAGNOSIS — I428 Other cardiomyopathies: Secondary | ICD-10-CM

## 2020-06-11 DIAGNOSIS — I5022 Chronic systolic (congestive) heart failure: Secondary | ICD-10-CM

## 2020-06-11 LAB — BASIC METABOLIC PANEL
BUN/Creatinine Ratio: 11 (ref 9–20)
BUN: 12 mg/dL (ref 6–24)
CO2: 27 mmol/L (ref 20–29)
Calcium: 9.4 mg/dL (ref 8.7–10.2)
Chloride: 103 mmol/L (ref 96–106)
Creatinine, Ser: 1.13 mg/dL (ref 0.76–1.27)
GFR calc Af Amer: 82 mL/min/{1.73_m2} (ref >59)
GFR calc non Af Amer: 71 mL/min/{1.73_m2} (ref >59)
Glucose: 245 mg/dL — ABNORMAL HIGH (ref 65–99)
Potassium: 4 mmol/L (ref 3.5–5.2)
Sodium: 139 mmol/L (ref 134–144)

## 2020-06-11 LAB — CBC
Hematocrit: 47.8 % (ref 37.5–51.0)
Hemoglobin: 15.7 g/dL (ref 13.0–17.7)
MCH: 30.2 pg (ref 26.6–33.0)
MCHC: 32.8 g/dL (ref 31.5–35.7)
MCV: 92 fL (ref 79–97)
Platelets: 327 10*3/uL (ref 150–450)
RBC: 5.2 x10E6/uL (ref 4.14–5.80)
RDW: 14.7 % (ref 11.6–15.4)
WBC: 8 10*3/uL (ref 3.4–10.8)

## 2020-06-11 NOTE — Telephone Encounter (Signed)
    Pt missed his covid test, he wanted to if he can still make it today or r/s tomorrow. He wanted to know what will happen to his procedure if that will be push back

## 2020-06-11 NOTE — Telephone Encounter (Signed)
Pt in for labs today.  Pt states he missed his Covid test but it has now been rescheduled for 06/12/2020 at 2pm.  Pt reports he his transportation he had lined up for 06/13/2020 for ICD implant has canceled.  Pt is asking for transportation assistance.  RN sent secure chat to Nigel Mormon cath lab manager to see if this could be arranged.  Will contact pt with details once arrangements have been made.  Pt verbalizes understanding and agrees with current plan.

## 2020-06-12 ENCOUNTER — Other Ambulatory Visit (HOSPITAL_COMMUNITY)
Admission: RE | Admit: 2020-06-12 | Discharge: 2020-06-12 | Disposition: A | Discharge status: Home / Self Care | Payer: Medicaid Other | Source: Ambulatory Visit | Attending: Internal Medicine | Admitting: Internal Medicine

## 2020-06-12 ENCOUNTER — Telehealth: Payer: Self-pay | Admitting: Licensed Clinical Social Worker

## 2020-06-12 DIAGNOSIS — Z01812 Encounter for preprocedural laboratory examination: Secondary | ICD-10-CM | POA: Diagnosis present

## 2020-06-12 DIAGNOSIS — Z20822 Contact with and (suspected) exposure to covid-19: Secondary | ICD-10-CM | POA: Diagnosis not present

## 2020-06-12 LAB — SARS CORONAVIRUS 2 (TAT 6-24 HRS): SARS Coronavirus 2: NEGATIVE

## 2020-06-12 NOTE — Telephone Encounter (Signed)
CSW received referral to assist with transportation to procedure tomorrow morning. CSW made arrangements with [employer] to provide round trip transport. CSW contacted patient and he is grateful for the support and assistance. [employer] will confirm pick up with patient. Lasandra Beech, LCSW, CCSW-MCS [phone]

## 2020-06-12 NOTE — Telephone Encounter (Signed)
Contacted Lasandra Beech, SW and requested assistance with arranging transportation for pt to ICD implant scheduled for 06/13/2020 with arrival at 630am.   Per SW - OK I made arrangements for round trip tranport tomorrow morning. They will confirm pick up with the patient but anticipate pick up at 6am. I called the patient and let him know the arrangements. Thanks!

## 2020-06-13 ENCOUNTER — Ambulatory Visit (HOSPITAL_COMMUNITY)
Admission: RE | Disposition: A | Discharge status: Home / Self Care | Payer: Self-pay | Source: Home / Self Care | Attending: Internal Medicine

## 2020-06-13 ENCOUNTER — Other Ambulatory Visit: Payer: Self-pay

## 2020-06-13 ENCOUNTER — Ambulatory Visit (HOSPITAL_COMMUNITY)
Admission: RE | Admit: 2020-06-13 | Discharge: 2020-06-13 | Disposition: A | Discharge status: Home / Self Care | Payer: Medicaid Other | Attending: Internal Medicine | Admitting: Internal Medicine

## 2020-06-13 ENCOUNTER — Ambulatory Visit (HOSPITAL_COMMUNITY): Payer: Medicaid Other

## 2020-06-13 DIAGNOSIS — F4310 Post-traumatic stress disorder, unspecified: Secondary | ICD-10-CM | POA: Diagnosis not present

## 2020-06-13 DIAGNOSIS — F329 Major depressive disorder, single episode, unspecified: Secondary | ICD-10-CM | POA: Insufficient documentation

## 2020-06-13 DIAGNOSIS — E119 Type 2 diabetes mellitus without complications: Secondary | ICD-10-CM | POA: Insufficient documentation

## 2020-06-13 DIAGNOSIS — E785 Hyperlipidemia, unspecified: Secondary | ICD-10-CM | POA: Diagnosis not present

## 2020-06-13 DIAGNOSIS — I5022 Chronic systolic (congestive) heart failure: Secondary | ICD-10-CM | POA: Insufficient documentation

## 2020-06-13 DIAGNOSIS — I428 Other cardiomyopathies: Secondary | ICD-10-CM

## 2020-06-13 DIAGNOSIS — K219 Gastro-esophageal reflux disease without esophagitis: Secondary | ICD-10-CM | POA: Insufficient documentation

## 2020-06-13 DIAGNOSIS — Z006 Encounter for examination for normal comparison and control in clinical research program: Secondary | ICD-10-CM | POA: Diagnosis not present

## 2020-06-13 DIAGNOSIS — Z7984 Long term (current) use of oral hypoglycemic drugs: Secondary | ICD-10-CM | POA: Diagnosis not present

## 2020-06-13 DIAGNOSIS — G4733 Obstructive sleep apnea (adult) (pediatric): Secondary | ICD-10-CM | POA: Diagnosis not present

## 2020-06-13 DIAGNOSIS — Z7982 Long term (current) use of aspirin: Secondary | ICD-10-CM | POA: Insufficient documentation

## 2020-06-13 DIAGNOSIS — Z79899 Other long term (current) drug therapy: Secondary | ICD-10-CM | POA: Insufficient documentation

## 2020-06-13 DIAGNOSIS — I421 Obstructive hypertrophic cardiomyopathy: Secondary | ICD-10-CM | POA: Diagnosis not present

## 2020-06-13 DIAGNOSIS — I110 Hypertensive heart disease with heart failure: Secondary | ICD-10-CM | POA: Insufficient documentation

## 2020-06-13 DIAGNOSIS — Z959 Presence of cardiac and vascular implant and graft, unspecified: Secondary | ICD-10-CM

## 2020-06-13 HISTORY — PX: ICD IMPLANT: EP1208

## 2020-06-13 LAB — GLUCOSE, CAPILLARY: Glucose-Capillary: 160 mg/dL — ABNORMAL HIGH (ref 70–99)

## 2020-06-13 SURGERY — ICD IMPLANT

## 2020-06-13 MED ORDER — MIDAZOLAM HCL 5 MG/5ML IJ SOLN
INTRAMUSCULAR | Status: AC
  Filled 2020-06-13: qty 5

## 2020-06-13 MED ORDER — MIDAZOLAM HCL 5 MG/5ML IJ SOLN
INTRAMUSCULAR | Status: DC | PRN
  Administered 2020-06-13 (×3): 1 mg via INTRAVENOUS
  Administered 2020-06-13: 2 mg via INTRAVENOUS

## 2020-06-13 MED ORDER — SODIUM CHLORIDE 0.9 % IV SOLN: INTRAVENOUS | Status: DC

## 2020-06-13 MED ORDER — SODIUM CHLORIDE 0.9 % IV SOLN
80.0000 mg | INTRAVENOUS | Status: AC
  Administered 2020-06-13: 80 mg

## 2020-06-13 MED ORDER — LIDOCAINE HCL (PF) 1 % IJ SOLN
INTRAMUSCULAR | Status: DC | PRN
  Administered 2020-06-13: 60 mL
  Administered 2020-06-13: 20 mL

## 2020-06-13 MED ORDER — SODIUM CHLORIDE 0.9 % IV SOLN
INTRAVENOUS | Status: AC
  Filled 2020-06-13: qty 2

## 2020-06-13 MED ORDER — FENTANYL CITRATE (PF) 100 MCG/2ML IJ SOLN
INTRAMUSCULAR | Status: DC | PRN
  Administered 2020-06-13 (×3): 25 ug via INTRAVENOUS

## 2020-06-13 MED ORDER — LIDOCAINE HCL 1 % IJ SOLN
INTRAMUSCULAR | Status: AC
  Filled 2020-06-13: qty 20

## 2020-06-13 MED ORDER — ACETAMINOPHEN 325 MG PO TABS
325.0000 mg | ORAL_TABLET | ORAL | Status: DC | PRN
  Filled 2020-06-13: qty 2

## 2020-06-13 MED ORDER — FENTANYL CITRATE (PF) 100 MCG/2ML IJ SOLN
INTRAMUSCULAR | Status: AC
  Filled 2020-06-13: qty 2

## 2020-06-13 MED ORDER — HEPARIN (PORCINE) IN NACL 1000-0.9 UT/500ML-% IV SOLN
INTRAVENOUS | Status: DC | PRN
  Administered 2020-06-13: 500 mL

## 2020-06-13 MED ORDER — ONDANSETRON HCL 4 MG/2ML IJ SOLN: 4.0000 mg | Freq: Four times a day (QID) | INTRAMUSCULAR | Status: DC | PRN

## 2020-06-13 MED ORDER — CEFAZOLIN SODIUM-DEXTROSE 2-4 GM/100ML-% IV SOLN
INTRAVENOUS | Status: AC
  Filled 2020-06-13: qty 100

## 2020-06-13 MED ORDER — CHLORHEXIDINE GLUCONATE 4 % EX LIQD: 4.0000 "application " | Freq: Once | CUTANEOUS | Status: DC

## 2020-06-13 MED ORDER — LIDOCAINE HCL 1 % IJ SOLN
INTRAMUSCULAR | Status: AC
  Filled 2020-06-13: qty 60

## 2020-06-13 MED ORDER — CEFAZOLIN SODIUM-DEXTROSE 2-4 GM/100ML-% IV SOLN
2.0000 g | INTRAVENOUS | Status: AC
  Administered 2020-06-13: 2 g via INTRAVENOUS

## 2020-06-13 SURGICAL SUPPLY — 7 items
CABLE SURGICAL S-101-97-12 (CABLE) ×2 IMPLANT
HEMOSTAT SURGICEL 2X4 FIBR (HEMOSTASIS) ×1 IMPLANT
ICD MOMENTUM D120 (ICD Generator) ×1 IMPLANT
LEAD RELIANCE 0138-64 (Lead) ×1 IMPLANT
PAD PRO RADIOLUCENT 2001M-C (PAD) ×2 IMPLANT
SHEATH 9.5FR PRELUDE SNAP 13 (SHEATH) ×1 IMPLANT
TRAY PACEMAKER INSERTION (PACKS) ×2 IMPLANT

## 2020-06-13 NOTE — Discharge Instructions (Signed)
After Your ICD (Implantable Cardiac Defibrillator)   . You have a [employer] Jude ICD  Dermabond will come off in next 10-14 days; if not will remove at wound check  Keep wound dry until tomorrow evening  No Driving x 4 days  Wound check in office as scheduled    ACTIVITY . Do not lift your arm above shoulder height for 1 week after your procedure. After 7 days, you may progress as below.     Wednesday June 20, 2020  Thursday June 21, 2020 Friday June 22, 2020 Saturday June 23, 2020   . Do not lift, push, pull, or carry anything over 10 pounds with the affected arm until 6 weeks (Wednesday July 25, 2020 ) after your procedure.   Do NOT DRIVE for 4 days.  . Ask your healthcare provider when you can go back to work   INCISION/Dressing  . Monitor your defibrillator site for redness, swelling, and drainage. Call the device clinic at [phone] if you experience these symptoms or fever/chills.  . If your incision is closed with Dermabond/Surgical glue. You may shower 1 day after your pacemaker implant and wash around the site with soap and water.    Marland Kitchen Avoid lotions, ointments, or perfumes over your incision until it is well-healed.  . You may use a hot tub or a pool AFTER your wound check appointment if the incision is completely closed.  . Your ICD is designed to protect you from life threatening heart rhythms. Because of this, you may receive a shock.   o 1 shock with no symptoms:  Call the office during business hours. o 1 shock with symptoms (chest pain, chest pressure, dizziness, lightheadedness, shortness of breath, overall feeling unwell):  Call 911. o If you experience 2 or more shocks in 24 hours:  Call 911. o If you receive a shock, you should not drive for 6 months per the [place] DMV IF you receive appropriate therapy from your ICD.   . ICD Alerts:  Some alerts are vibratory and others beep. These are NOT emergencies. Please call our office to let us know. If this occurs at  night or on weekends, it can wait until the next business day. Send a remote transmission.  . If your device is capable of reading fluid status (for heart failure), you will be offered monthly monitoring to review this with you.   DEVICE MANAGEMENT . Remote monitoring is used to monitor your ICD from home. This monitoring is scheduled every 91 days by our office. It allows [place] to keep an eye on the functioning of your device to ensure it is working properly. You will routinely see your Electrophysiologist annually (more often if necessary).   . You should receive your ID card for your new device in 4-8 weeks. Keep this card with you at all times once received. Consider wearing a medical alert bracelet or necklace.  . Your ICD  may be MRI compatible. This will be discussed at your next office visit/wound check.  You should avoid contact with strong electric or magnetic fields.    Do not use amateur (ham) radio equipment or electric (arc) welding torches. MP3 player headphones with magnets should not be used. Some devices are safe to use if held at least 12 inches (30 cm) from your defibrillator. These include power tools, lawn mowers, and speakers. If you are unsure if something is safe to use, ask your health care provider.   When using your cell phone, hold  it to the ear that is on the opposite side from the defibrillator. Do not leave your cell phone in a pocket over the defibrillator.   You may safely use electric blankets, heating pads, computers, and microwave ovens.  Call the office right away if:  You have chest pain.  You feel more than one shock.  You feel more short of breath than you have felt before.  You feel more light-headed than you have felt before.  Your incision starts to open up.  This information is not intended to replace advice given to you by your health care provider. Make sure you discuss any questions you have with your health care provider.

## 2020-06-13 NOTE — Interval H&P Note (Signed)
ICD Criteria  Current LVEF:30%. Within 12 months prior to implant: Yes   Heart failure history: Yes, Class III  Cardiomyopathy history: Yes, Non-Ischemic Cardiomyopathy.  Atrial Fibrillation/Atrial Flutter: No.  Ventricular tachycardia history: No.  Cardiac arrest history: No.  History of syndromes with risk of sudden death: No.  Previous ICD: No.  Current ICD indication: Primary  PPM indication: No.  Class I or II Bradycardia indication present: No  Beta Blocker therapy for 3 or more months: Yes, prescribed.   Ace Inhibitor/ARB therapy for 3 or more months: Yes, prescribed.    I have seen Richard [name] is a 59 y.o. malepre-procedural and has been referred by DB for consideration of ICD implant for primary prevention of sudden death.  The patient's chart has been reviewed and they meet criteria for ICD implant.  I have had a thorough discussion with the patient reviewing options.  The patient and their family (if available) have had opportunities to ask questions and have them answered. The patient and I have decided together through the [facility] Heart Care Share Decision Support Tool to implant ICD at this time.  Risks, benefits, alternatives to ICD implantation were discussed in detail with the patient today. The patient  understands that the risks include but are not limited to bleeding, infection, pneumothorax, perforation, tamponade, vascular damage, renal failure, MI, stroke, death, inappropriate shocks, and lead dislodgement and  wishes to proceed.  History and Physical Interval Note:  06/13/2020 3:43 PM  Richard [name]  has presented today for surgery, with the diagnosis of cardiomyopathy.  The various methods of treatment have been discussed with the patient and family. After consideration of risks, benefits and other options for treatment, the patient has consented to  Procedure(s): ICD IMPLANT (N/A) as a surgical intervention.  The patient's history has been  reviewed, patient examined, no change in status, stable for surgery.  I have reviewed the patient's chart and labs.  Questions were answered to the patient's satisfaction.     Sherryl Manges

## 2020-06-13 NOTE — Progress Notes (Signed)
Pt placed on CPAP at this time in Cath Lab per MD order. Pt tolerating well

## 2020-06-13 NOTE — H&P (Signed)
Patient Care Team: Marlyn Corporal, PA as PCP - General (Family Medicine) Nahser, Deloris Ping, MD as PCP - Cardiology (Cardiology)   HPI  Richard [name] is a 59 y.o. male With nonischemic cardiomyopathy and modest CHF, class 2  Stable symptoms  Without edema or nocturnal dyspnea  No syncope palpitations   DATE TEST EF   2002 LHC  [place]-- w/o obstruction   8/03 Echo   40 %   10/20 Echo   20-25 %   1/21 Echo  <20%  trabeculations concerning for LV [place]  4/21 cMRI 30% hypertrabeculations c/w noncompaction    Date Cr K Hgb  2/21 1.07 4.6     6/21 1.13 4.0 15.7     Records and Results Reviewed   Past Medical History:  Diagnosis Date  . Anxiety   . Chronic systolic CHF 05/09/2019   Echocardiogram 12/2019: EF < 20, severe LVH, Gr 1 DD, trabeculations c/w LV non-compaction  . Depression   . Diabetes mellitus, type 2 (HCC)   . GERD (gastroesophageal reflux disease)   . Hyperlipidemia   . Hypertension   . Hypertrophic obstructive cardiomyopathy   . Insomnia   . Left ventricular dysfunction    EF 35%  . Personality disorder (HCC)   . PTSD (post-traumatic stress disorder)   . Sleep apnea     Past Surgical History:  Procedure Laterality Date  . cardiac catherization    . TONSILLECTOMY      Current Facility-Administered Medications  Medication Dose Route Frequency Provider Last Rate Last Admin  . 0.9 %  sodium chloride infusion   Intravenous Continuous Duke Salvia, MD 50 mL/hr at 06/13/20 0726 New Bag at 06/13/20 0726  . 0.9 %  sodium chloride infusion   Intravenous Continuous Duke Salvia, MD 50 mL/hr at 06/13/20 0725 New Bag at 06/13/20 0725  . ceFAZolin (ANCEF) IVPB 2g/100 mL premix  2 g Intravenous On Call Duke Salvia, MD      . chlorhexidine (HIBICLENS) 4 % liquid 4 application  4 application Topical Once Duke Salvia, MD      . gentamicin (GARAMYCIN) 80 mg in sodium chloride 0.9 % 500 mL irrigation  80 mg Irrigation On Call Duke Salvia, MD        No Known Allergies    Social History   Tobacco Use  . Smoking status: Never Smoker  . Smokeless tobacco: Never Used  Substance Use Topics  . Alcohol use: Yes    Comment: rare  . Drug use: No     Family History  Problem Relation Age of Onset  . Heart attack Other   . Hypertension Father   . Heart disease Father   . Hyperlipidemia Father   . Colon cancer Brother 14  . Colon cancer Maternal Grandfather   . Hypertension Mother   . Stroke Mother   . Esophageal cancer Neg Hx   . Rectal cancer Neg Hx   . Stomach cancer Neg Hx      Current Facility-Administered Medications for the 06/13/20 encounter [facility] Encounter)  Medication  . 0.9 %  sodium chloride infusion   Current Meds  Medication Sig  . acetaminophen (TYLENOL) 500 MG tablet Take 1,000 mg by mouth every 6 (six) hours as needed.  Marland Kitchen aspirin EC 81 MG tablet Take 81 mg by mouth daily.  Marland Kitchen atorvastatin (LIPITOR) 20 MG tablet Take 20 mg by mouth daily.  Marland Kitchen buPROPion (WELLBUTRIN XL) 300 MG  24 hr tablet Take 300 mg by mouth daily.   . carvedilol (COREG) 25 MG tablet Take 25 mg by mouth 2 (two) times daily with a meal.  . dapagliflozin propanediol (FARXIGA) 10 MG TABS tablet Take 10 mg by mouth daily.  Marland Kitchen FLUoxetine (PROZAC) 20 MG capsule Take 60 mg by mouth daily.   . folic acid (FOLVITE) 932 MCG tablet Take 400 mcg by mouth daily.  Marland Kitchen glipiZIDE (GLUCOTROL) 10 MG tablet Take 10 mg by mouth 2 (two) times daily before a meal.   . melatonin 5 MG TABS Take 5 mg by mouth at bedtime as needed (sleep).  . metFORMIN (GLUCOPHAGE) 1000 MG tablet Take 1,000 mg by mouth 2 (two) times daily with a meal.   . Multiple Vitamin (MULTIVITAMIN) capsule Take 1 capsule by mouth daily.  Marland Kitchen omeprazole (PRILOSEC) 20 MG capsule Take 20 mg by mouth daily.  . sacubitril-valsartan (ENTRESTO) 97-103 MG Take 1 tablet by mouth 2 (two) times daily.  Marland Kitchen spironolactone (ALDACTONE) 25 MG tablet Take 1 tablet (25 mg total) by mouth daily.      Review of Systems negative except from HPI and PMH  Physical Exam BP (!) 148/80   Pulse 68   Temp 98.6 F (37 C) (Oral)   Resp 16   Ht 6\' 1"  (1.854 m)   Wt 129.3 kg   SpO2 96%   BMI 37.60 kg/m  Well developed and well nourished in no acute distress HENT normal E scleral and icterus clear Neck Supple JVP flat; carotids brisk and full Clear to ausculation  Regular rate and rhythm, no murmurs gallops or rub Soft with active bowel sounds No clubbing cyanosis  Edema Alert and oriented, grossly normal motor and sensory function Skin Warm and Dry  ECG sinus @ 66 Narrow QRS   Assessment and  Plan  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  The patients understanding  and  willing to proceed.

## 2020-06-14 ENCOUNTER — Encounter (HOSPITAL_COMMUNITY): Payer: Self-pay | Admitting: Internal Medicine

## 2020-06-14 MED FILL — Lidocaine HCl Local Inj 1%: INTRAMUSCULAR | Qty: 80 | Status: AC

## 2020-06-14 MED FILL — Cefazolin Sodium-Dextrose IV Solution 2 GM/100ML-4%: INTRAVENOUS | Qty: 100 | Status: AC

## 2020-06-14 MED FILL — Gentamicin Sulfate Inj 40 MG/ML: INTRAMUSCULAR | Qty: 80 | Status: AC

## 2020-06-26 ENCOUNTER — Other Ambulatory Visit: Payer: Self-pay | Admitting: Cardiovascular Disease

## 2020-06-26 ENCOUNTER — Encounter: Payer: Self-pay | Admitting: Cardiovascular Disease

## 2020-06-26 ENCOUNTER — Ambulatory Visit: Payer: Medicaid Other

## 2020-06-26 NOTE — Telephone Encounter (Signed)
error 

## 2020-06-26 NOTE — Telephone Encounter (Signed)
*  STAT* If patient is at the pharmacy, call can be transferred to refill team.   1. Which medications need to be refilled? (please list name of each medication and dose if known)  dapagliflozin propanediol (FARXIGA) 10 MG TABS tablet atorvastation 10mg  tablet  2. Which pharmacy/location (including street and city if local pharmacy) is medication to be sent to? [id number] - [place], [place] - [address]  3. Do they need a 30 day or 90 day supply? 90 day

## 2020-06-27 MED ORDER — ATORVASTATIN CALCIUM 20 MG PO TABS: 20.0000 mg | ORAL_TABLET | Freq: Every day | ORAL | 5 refills | Status: DC

## 2020-06-27 NOTE — Telephone Encounter (Signed)
Pt's pharmacy is requesting a refill on dapagliflozin propanediol Marcelline Deist). Would Dr. Elease Hashimoto like to refill this medication? Please address

## 2020-06-28 ENCOUNTER — Other Ambulatory Visit: Payer: Self-pay

## 2020-06-28 ENCOUNTER — Ambulatory Visit (INDEPENDENT_AMBULATORY_CARE_PROVIDER_SITE_OTHER): Payer: Medicaid Other | Admitting: Emergency Medicine

## 2020-06-28 ENCOUNTER — Other Ambulatory Visit: Payer: Self-pay | Admitting: Cardiovascular Disease

## 2020-06-28 DIAGNOSIS — I428 Other cardiomyopathies: Secondary | ICD-10-CM | POA: Diagnosis not present

## 2020-06-28 LAB — CUP PACEART INCLINIC DEVICE CHECK
Brady Statistic RV Percent Paced: 1 % — CL
Date Time Interrogation Session: 20210708173536
HighPow Impedance: 73 Ohm
Implantable Lead Implant Date: 20210623
Implantable Lead Location: 753860
Implantable Lead Model: 138
Implantable Lead Serial Number: 302960
Implantable Pulse Generator Implant Date: 20210623
Lead Channel Impedance Value: 599 Ohm
Lead Channel Pacing Threshold Amplitude: 0.6 V
Lead Channel Pacing Threshold Pulse Width: 0.4 ms
Lead Channel Sensing Intrinsic Amplitude: 24.7 mV
Lead Channel Setting Pacing Amplitude: 3.5 V
Lead Channel Setting Pacing Pulse Width: 0.4 ms
Lead Channel Setting Sensing Sensitivity: 0.5 mV
Pulse Gen Serial Number: 205833

## 2020-06-28 NOTE — Telephone Encounter (Signed)
Per office note from 11/15/19 diabetes is followed by primary care.  Please send this refill to PCP

## 2020-06-28 NOTE — Telephone Encounter (Signed)
*  STAT* If patient is at the pharmacy, call can be transferred to refill team.   1. Which medications need to be refilled? (please list name of each medication and dose if known)  atorvastatin (LIPITOR) 20 MG tablet liflozin propanediol (FARXIGA) 10 MG TABS tablet  2. Which pharmacy/location (including street and city if local pharmacy) is medication to be sent to? [facility] - [place], [place] - [address]  3. Do they need a 30 day or 90 day supply? 30   Patient said the pharmacy does not have any new prescriptions on file for these two medications.   The patient is out of these medications

## 2020-06-28 NOTE — Patient Instructions (Signed)
Upon returning home, plug in home monitor and send manual transmission.

## 2020-06-28 NOTE — Progress Notes (Signed)
Wound check appointment. Steri-strips removed. Wound without redness or edema. Incision edges approximated, wound well healed. Normal device function. Thresholds, sensing, and impedances consistent with implant measurements. Device programmed at 3.5V for extra safety margin until 3 month visit. Histogram distribution appropriate for patient and level of activity. No ventricular arrhythmias noted.  Estimated longevity 14.5 years. Pt educated to plug in home monitor upon returning home today, next scheduled transmission is 09/13/20.  ROV with Dr. Graciela Husbands on 09/18/20. Patient education completed including shock plan. Auditory/vibratory alert demonstrated.

## 2020-06-28 NOTE — Telephone Encounter (Signed)
Pt calling back concerning his medication Farxiga. A note was sent early today and Dossie Arbour, RN     8:45 AM Note Per office note from 11/15/19 diabetes is followed by primary care.  Please send this refill to PCP    June 27, 2020 I explained this to the pt and the pt stated that he can not get a refill from his PCP, because he needs an appt and they can not see him for 2 weeks and pt states that he is out of medication. Please address

## 2020-06-28 NOTE — Telephone Encounter (Signed)
Lm for Graylon Gunning PA re pt needing refill for Farxiga Per pt has ran out completely and needs refills and no availabaility for appt per pt for 2 weeks .Zack Seal

## 2020-06-29 MED ORDER — DAPAGLIFLOZIN PROPANEDIOL 10 MG PO TABS: 10.0000 mg | ORAL_TABLET | Freq: Every day | ORAL | 1 refills | Status: DC

## 2020-06-29 NOTE — Telephone Encounter (Signed)
I sent in a #30 supply with 1 refill. He has upcoming appointments with cardiology.

## 2020-07-02 ENCOUNTER — Telehealth: Payer: Self-pay | Admitting: Cardiovascular Disease

## 2020-07-02 NOTE — Telephone Encounter (Signed)
Left message for patient advising that I have sent a short-term refill of his Marcelline Deist to his pharmacy but he should call his PCP office for appointment to manage his DM.  I advised that he is also due to see cardiology and that he should call back to schedule an appointment either with Dr. Gala Romney or Dr. Elease Hashimoto. I advised that he call and ask for me if he has questions.

## 2020-07-02 NOTE — Telephone Encounter (Signed)
Amy Hill from [facility] calling and returning phone call regarding patient. Please call back

## 2020-07-02 NOTE — Telephone Encounter (Signed)
Spoke with Amy, nurse at [facility] who states patient has not been seen since 9/20. He was a no show for his appointment on 03/09/20 and then cancelled appointment Friday 7/9. Amy states that Graylon Gunning, PA will not fill prescriptions without seeing the pt. He can be seen there again if he will call and schedule and keep the appointment. I advised that I sent #30 tablets of Farxiga and will call to discuss with the pt. Amy thanked me for the help and I thanked her for the update.

## 2020-07-16 IMAGING — CR DG CHEST 2V
2 series · 2 of 2 positions shown · non-contrast
Comparison: None.

CLINICAL DATA: Status post AICD insertion today.

EXAM:
CHEST - 2 VIEW

[w chest pa]
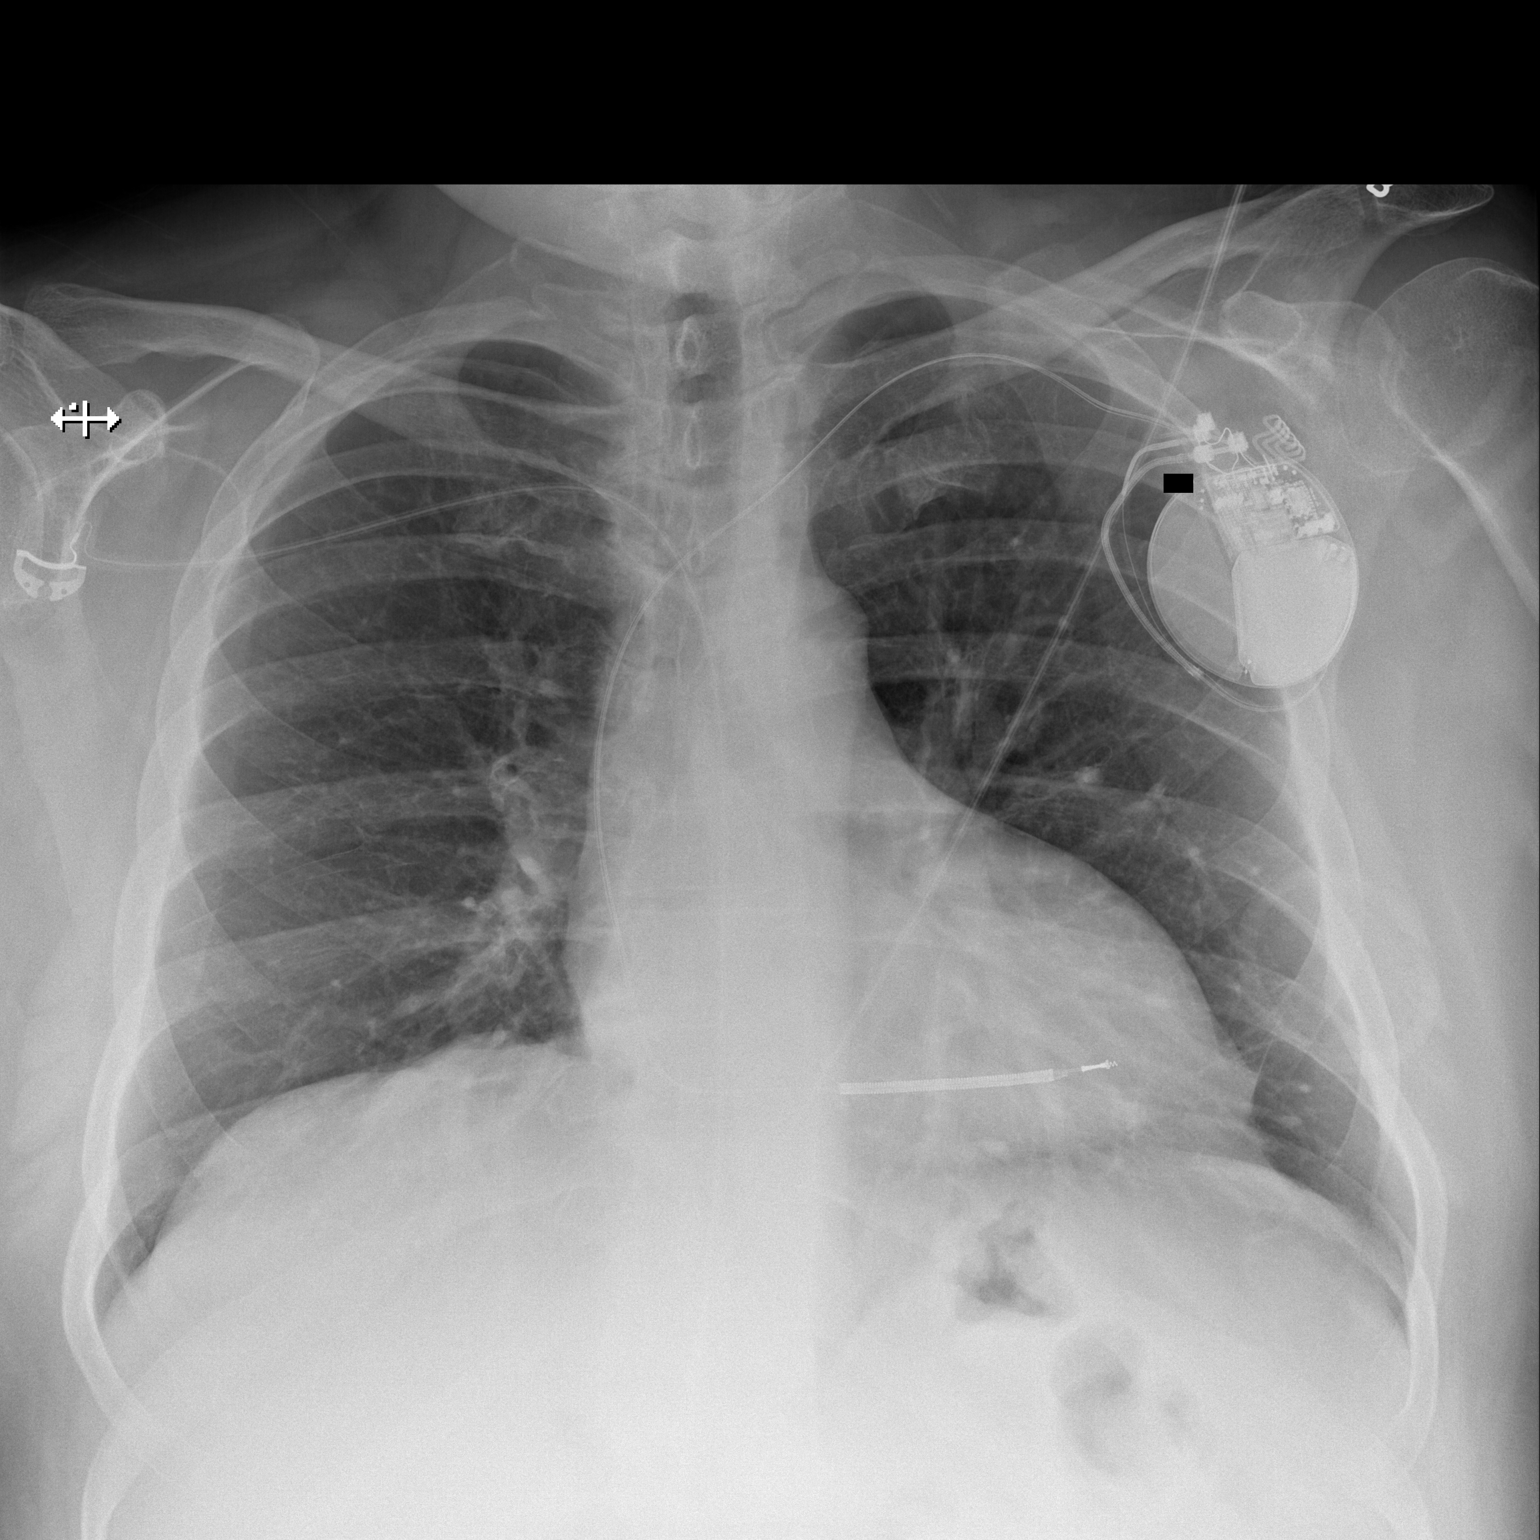

[w chest lat]
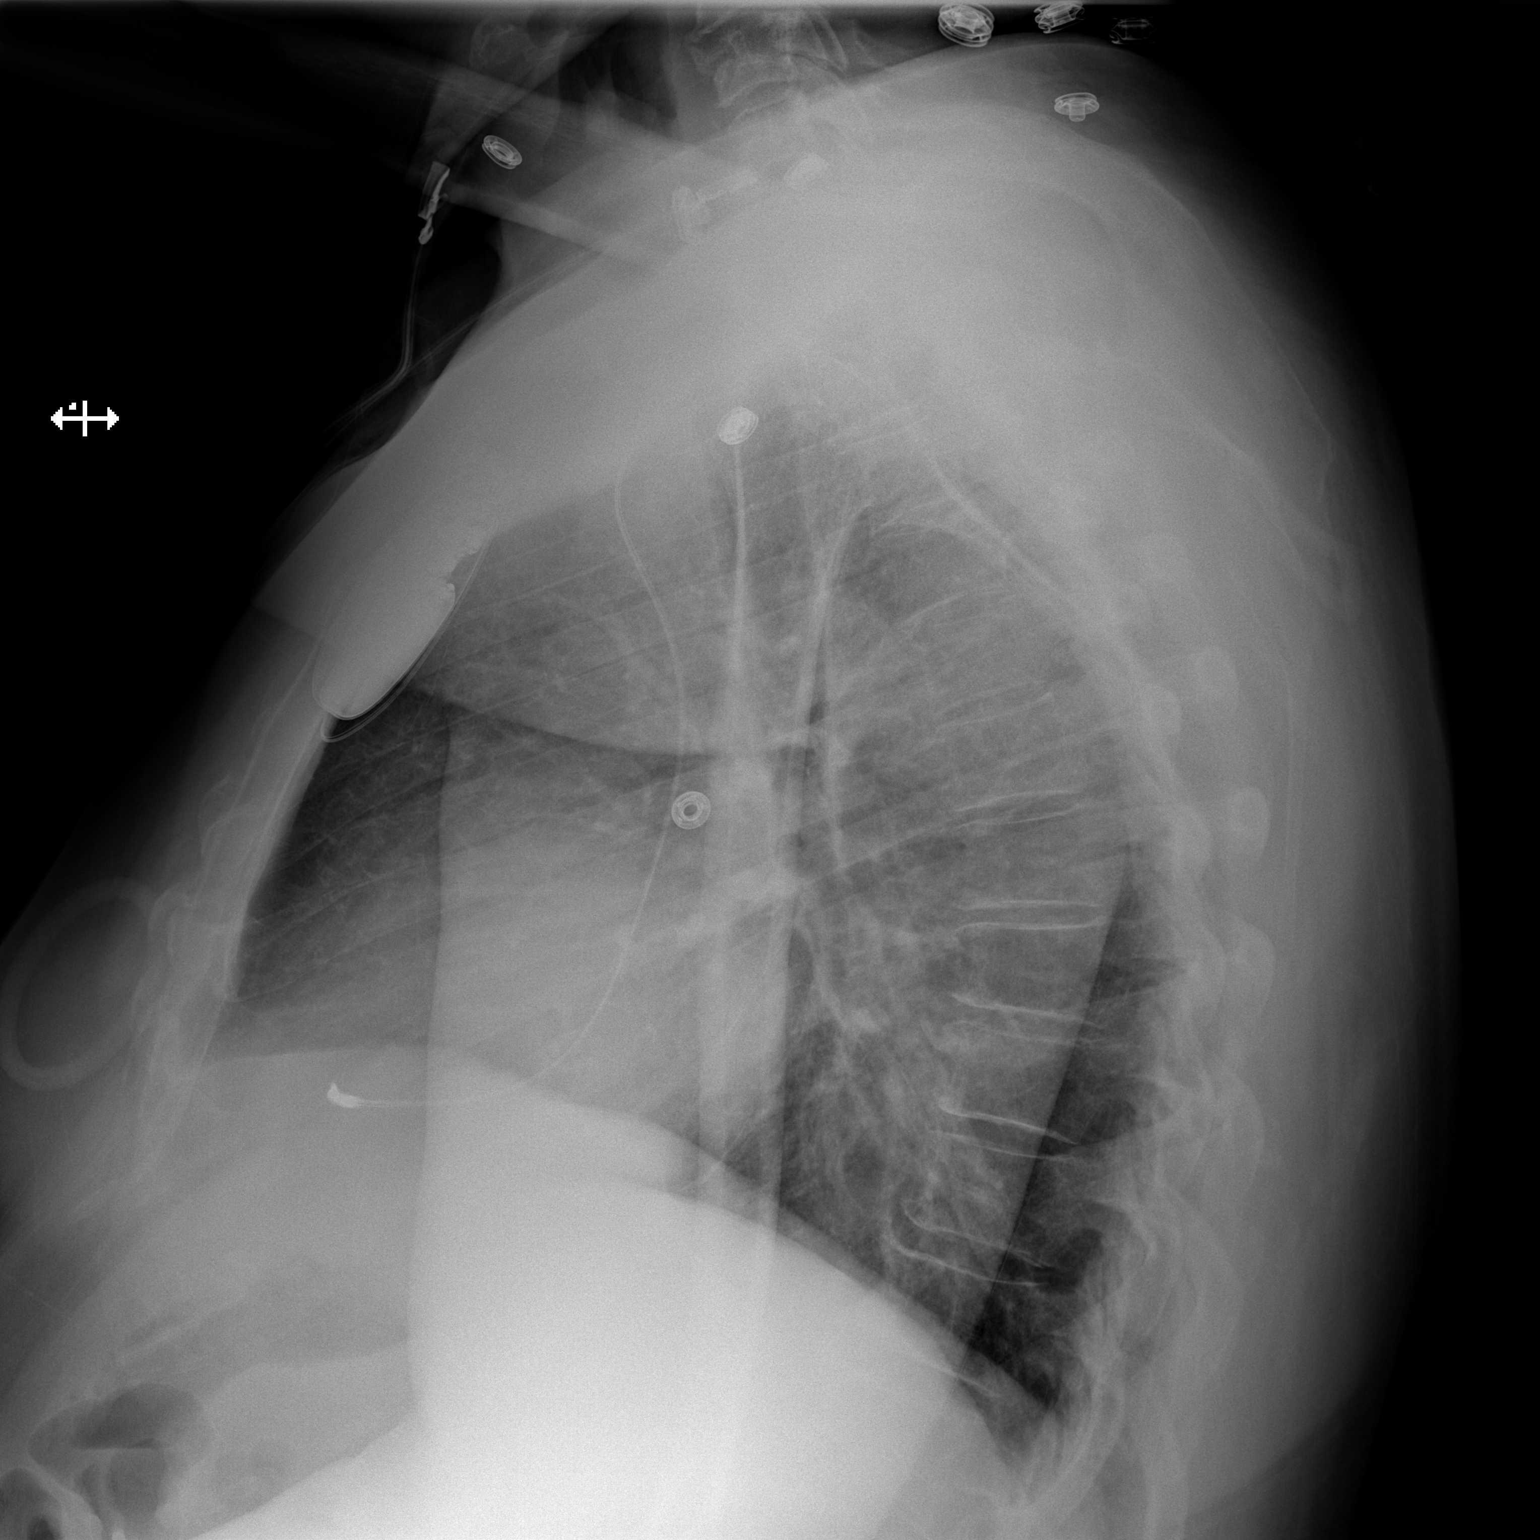

[2 of 2 positions shown; findings below may reference images not displayed]

FINDINGS: Head CT in place. Heart size and vascularity are normal. Lungs are
clear. No effusions. No bone abnormality. No pneumothorax.
IMPRESSION: No active cardiopulmonary disease.  AICD in place.

## 2020-09-13 ENCOUNTER — Ambulatory Visit (INDEPENDENT_AMBULATORY_CARE_PROVIDER_SITE_OTHER): Payer: Medicaid Other | Admitting: Emergency Medicine

## 2020-09-13 DIAGNOSIS — I5022 Chronic systolic (congestive) heart failure: Secondary | ICD-10-CM

## 2020-09-13 LAB — CUP PACEART REMOTE DEVICE CHECK
Battery Remaining Longevity: 150 mo
Battery Remaining Percentage: 100 %
Brady Statistic RV Percent Paced: 0 %
Date Time Interrogation Session: 20210923051500
HighPow Impedance: 85 Ohm
Implantable Lead Implant Date: 20210623
Implantable Lead Location: 753860
Implantable Lead Model: 138
Implantable Lead Serial Number: 302960
Implantable Pulse Generator Implant Date: 20210623
Lead Channel Impedance Value: 688 Ohm
Lead Channel Setting Pacing Amplitude: 3.5 V
Lead Channel Setting Pacing Pulse Width: 0.4 ms
Lead Channel Setting Sensing Sensitivity: 0.5 mV
Pulse Gen Serial Number: 205833

## 2020-09-17 DIAGNOSIS — Z95810 Presence of automatic (implantable) cardiac defibrillator: Secondary | ICD-10-CM | POA: Insufficient documentation

## 2020-09-17 NOTE — Progress Notes (Signed)
Patient Care Team: Marlyn Corporal, PA as PCP - General (Family Medicine) Nahser, Deloris Ping, MD as PCP - Cardiology (Cardiology)   HPI  Richard [name] is a 59 y.o. male  Seen in followup for [facility]   ICD implanted 6/21 for primary prevention for NICM and possible LV [place]   The patient denies chest pain, shortness of breath, nocturnal dyspnea, orthopnea or peripheral edema.  There have been no palpitations, lightheadedness or syncope.   Anxiety is better with the presence of ICD    Vaccinated  Family history of coronary disease but no known cardiomyopathy.   DATE TEST EF   2002 LHC  Cors-- w/o obstruction   8/03 Echo   40 %   10/20 Echo   20-25 %   1/21 Echo  <20%  trabeculations concerning for LV [place]  4/21 cMRI 30%  Hypertrabeculations ratio 4.6:1 +LGE    Date Cr K Hgb  2/21 1.07 4.6     6/21 1.13 4.0 15.7     Records and Results Reviewed   Past Medical History:  Diagnosis Date  . Anxiety   . Chronic systolic CHF 05/09/2019   Echocardiogram 12/2019: EF < 20, severe LVH, Gr 1 DD, trabeculations c/w LV non-compaction  . Depression   . Diabetes mellitus, type 2 (HCC)   . GERD (gastroesophageal reflux disease)   . Hyperlipidemia   . Hypertension   . Hypertrophic obstructive cardiomyopathy   . Insomnia   . Left ventricular dysfunction    EF 35%  . Personality disorder (HCC)   . PTSD (post-traumatic stress disorder)   . Sleep apnea     Past Surgical History:  Procedure Laterality Date  . cardiac catherization    . ICD IMPLANT N/A 06/13/2020   Procedure: ICD IMPLANT;  Surgeon: Duke Salvia, MD;  Location: [facility] INVASIVE CV LAB;  Service: Cardiovascular;  Laterality: N/A;  . TONSILLECTOMY      Current Meds  Medication Sig  . acetaminophen (TYLENOL) 500 MG tablet Take 1,000 mg by mouth every 6 (six) hours as needed.  Marland Kitchen aspirin EC 81 MG tablet Take 81 mg by mouth daily.  Marland Kitchen atorvastatin (LIPITOR) 20 MG tablet Take 1 tablet (20 mg  total) by mouth daily.  Marland Kitchen buPROPion (WELLBUTRIN XL) 300 MG 24 hr tablet Take 300 mg by mouth daily.   . carvedilol (COREG) 25 MG tablet Take 25 mg by mouth 2 (two) times daily with a meal.  . dapagliflozin propanediol (FARXIGA) 10 MG TABS tablet Take 1 tablet (10 mg total) by mouth daily.  Marland Kitchen FLUoxetine (PROZAC) 20 MG capsule Take 60 mg by mouth daily.   . folic acid (FOLVITE) 400 MCG tablet Take 400 mcg by mouth daily.  Marland Kitchen glipiZIDE (GLUCOTROL) 10 MG tablet Take 10 mg by mouth 2 (two) times daily before a meal.   . melatonin 5 MG TABS Take 5 mg by mouth at bedtime as needed (sleep).  . metFORMIN (GLUCOPHAGE) 1000 MG tablet Take 1,000 mg by mouth 2 (two) times daily with a meal.   . Multiple Vitamin (MULTIVITAMIN) capsule Take 1 capsule by mouth daily.  Marland Kitchen omeprazole (PRILOSEC) 20 MG capsule Take 20 mg by mouth daily.  . sacubitril-valsartan (ENTRESTO) 97-103 MG Take 1 tablet by mouth 2 (two) times daily.   Current Facility-Administered Medications for the 09/18/20 encounter (Office Visit) with Duke Salvia, MD  Medication  . 0.9 %  sodium chloride infusion    No Known Allergies  Review of Systems negative except from HPI and PMH  Physical Exam BP 114/60   Pulse 84   Ht 6\' 1"  (1.854 m)   Wt 279 lb (126.6 kg)   SpO2 98%   BMI 36.81 kg/m  Well developed and well nourished in no acute distress HENT normal E scleral and icterus clear Neck Supple JVP flat; carotids brisk and full Clear to ausculation Device pocket well healed; without hematoma or erythema.  There is no tethering  Regular rate and rhythm, no murmurs gallops or rub Soft with active bowel sounds No clubbing cyanosis  Edema Alert and oriented, grossly normal motor and sensory function Skin Warm and Dry  ECG sius @ 86 16/10/38  CrCl cannot be calculated (Patient's most recent lab result is older than the maximum 21 days allowed.).   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  The patient does not  have concerns regarding medicines.

## 2020-09-18 ENCOUNTER — Ambulatory Visit (INDEPENDENT_AMBULATORY_CARE_PROVIDER_SITE_OTHER): Payer: Medicaid Other | Admitting: Internal Medicine

## 2020-09-18 ENCOUNTER — Encounter: Payer: Self-pay | Admitting: Internal Medicine

## 2020-09-18 ENCOUNTER — Other Ambulatory Visit: Payer: Self-pay

## 2020-09-18 VITALS — BP 114/60 | HR 84 | Ht 73.0 in | Wt 279.0 lb

## 2020-09-18 DIAGNOSIS — I5022 Chronic systolic (congestive) heart failure: Secondary | ICD-10-CM | POA: Diagnosis not present

## 2020-09-18 DIAGNOSIS — I428 Other cardiomyopathies: Secondary | ICD-10-CM | POA: Diagnosis not present

## 2020-09-18 DIAGNOSIS — Z95810 Presence of automatic (implantable) cardiac defibrillator: Secondary | ICD-10-CM | POA: Diagnosis not present

## 2020-09-18 LAB — CUP PACEART INCLINIC DEVICE CHECK
Brady Statistic RV Percent Paced: 1 % — CL
Date Time Interrogation Session: 20210928141554
HighPow Impedance: 81 Ohm
Implantable Lead Implant Date: 20210623
Implantable Lead Location: 753860
Implantable Lead Model: 138
Implantable Lead Serial Number: 302960
Implantable Pulse Generator Implant Date: 20210623
Lead Channel Impedance Value: 642 Ohm
Lead Channel Pacing Threshold Amplitude: 0.9 V
Lead Channel Pacing Threshold Pulse Width: 0.4 ms
Lead Channel Sensing Intrinsic Amplitude: 25 mV
Lead Channel Setting Pacing Amplitude: 3.5 V
Lead Channel Setting Pacing Pulse Width: 0.4 ms
Lead Channel Setting Sensing Sensitivity: 0.5 mV
Pulse Gen Serial Number: 205833

## 2020-09-18 MED ORDER — CARVEDILOL 25 MG PO TABS: 25.0000 mg | ORAL_TABLET | Freq: Two times a day (BID) | ORAL | 3 refills | Status: DC

## 2020-09-18 NOTE — Progress Notes (Signed)
Remote ICD transmission.   

## 2020-09-18 NOTE — Patient Instructions (Signed)
Medication Instructions:  Your physician recommends that you continue on your current medications as directed. Please refer to the Current Medication list given to you today.  *If you need a refill on your cardiac medications before your next appointment, please call your pharmacy*   Lab Work: None ordered.  If you have labs (blood work) drawn today and your tests are completely normal, you will receive your results only by: . MyChart Message (if you have MyChart) OR . A paper copy in the mail If you have any lab test that is abnormal or we need to change your treatment, we will call you to review the results.   Testing/Procedures: None ordered.    Follow-Up: At [facility], you and your health needs are our priority.  As part of our continuing mission to provide you with exceptional heart care, we have created designated Provider Care Teams.  These Care Teams include your primary Cardiologist (physician) and Advanced Practice Providers (APPs -  Physician Assistants and Nurse Practitioners) who all work together to provide you with the care you need, when you need it.  We recommend signing up for the patient portal called "MyChart".  Sign up information is provided on this After Visit Summary.  MyChart is used to connect with patients for Virtual Visits (Telemedicine).  Patients are able to view lab/test results, encounter notes, upcoming appointments, etc.  Non-urgent messages can be sent to your provider as well.   To learn more about what you can do with MyChart, go to [email or web address].    Your next appointment:   9 months  The format for your next appointment:   In Person  Provider:   Steven Klein, MD     

## 2020-11-05 ENCOUNTER — Other Ambulatory Visit: Payer: Self-pay | Admitting: Cardiovascular Disease

## 2020-11-05 ENCOUNTER — Other Ambulatory Visit (HOSPITAL_COMMUNITY): Payer: Self-pay | Admitting: Internal Medicine

## 2020-11-13 ENCOUNTER — Telehealth (HOSPITAL_COMMUNITY): Payer: Self-pay | Admitting: Pharmacy Technician

## 2020-11-13 NOTE — Telephone Encounter (Signed)
Patient Advocate Encounter   Received notification from Medicaid that prior authorization for Sherryll Burger is required.   PA submitted on [employer] [id number] W Status is pending   Will continue to follow.

## 2020-11-14 NOTE — Telephone Encounter (Signed)
Advanced Heart Failure Patient Advocate Encounter  Prior Authorization for Sherryll Burger has been approved.    PA# [id number] Effective dates: 11/13/20 through 11/09/21  Archer Asa, CPhT

## 2020-12-13 ENCOUNTER — Ambulatory Visit (INDEPENDENT_AMBULATORY_CARE_PROVIDER_SITE_OTHER): Payer: Medicaid Other

## 2020-12-13 DIAGNOSIS — I428 Other cardiomyopathies: Secondary | ICD-10-CM

## 2020-12-14 LAB — CUP PACEART REMOTE DEVICE CHECK
Battery Remaining Longevity: 150 mo
Battery Remaining Percentage: 100 %
Brady Statistic RV Percent Paced: 0 %
Date Time Interrogation Session: 20211223050100
HighPow Impedance: 85 Ohm
Implantable Lead Implant Date: 20210623
Implantable Lead Location: 753860
Implantable Lead Model: 138
Implantable Lead Serial Number: 302960
Implantable Pulse Generator Implant Date: 20210623
Lead Channel Impedance Value: 658 Ohm
Lead Channel Setting Pacing Amplitude: 2.5 V
Lead Channel Setting Pacing Pulse Width: 0.4 ms
Lead Channel Setting Sensing Sensitivity: 0.5 mV
Pulse Gen Serial Number: 205833

## 2020-12-26 NOTE — Progress Notes (Signed)
Remote ICD transmission.   

## 2021-01-15 NOTE — Progress Notes (Signed)
Virtual Visit via Telephone Note   This visit type was conducted due to national recommendations for restrictions regarding the COVID-19 Pandemic (e.g. social distancing) in an effort to limit this patient's exposure and mitigate transmission in our community.  Due to his co-morbid illnesses, this patient is at least at moderate risk for complications without adequate follow up.  This format is felt to be most appropriate for this patient at this time.  The patient did not have access to video technology/had technical difficulties with video requiring transitioning to audio format only (telephone).  All issues noted in this document were discussed and addressed.  No physical exam could be performed with this format.  Please refer to the patient's chart for his  consent to telehealth for [facility].    Date:  01/16/2021   ID:  Richard [name], DOB 1961-01-25, MRN [id number] The patient was identified using 2 identifiers.  Patient Location: Home Provider Location: Office/Clinic  PCP:  Marlyn Corporal, PA  Cardiologist:  Kristeen Miss, MD   Electrophysiologist:  None   Evaluation Performed:  Follow-Up Visit  Chief Complaint:  Follow-up (CHF)    Patient Profile: Richard [name] is a 60 y.o. male with:  Heart failure with reduced ejection fraction  ? Non-ischemic cardiomyopathy / noncompaction cardiomyopathy ? Notes indicate cath in 2002 with normal coronary arteries  L-R shunt was suggested on cath in 2002 (no report in Epic)  Hx of high output HF at time of cath in 2002 ? EF 40-45 (2005) ? EF 35 (2019) ? EF 25-30 (09/2019) ? EF < 20 (12/2019) ? CMR 4/21: EF 30; ?small focal inf infarct; hyper trabeculation c/w non compaction  ? Eval by geneticist (Dr. 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; genetic testing not indicated   S/p ICD   Diabetes mellitus   Hypertension   Hyperlipidemia   OSA  Morbid obesity  Anxiety/depression   PTSD   Prior  CV Studies:  Cardiac MRI 04/17/2020 Moderate LVH, EF 30, diffuse HK, LGE in the basal inferoseptal and inferior wall suspicious for small focal infarct, severe hyper trabeculations in mid and apical segments (ratio of trabeculated to nontrabeculated myocardium 4.6:1), mild decrease RVSF (EF 41), RV myocardium also hyper trabeculated; trivial MR, mild TR Findings consistent with noncompaction cardiomyopathy with severe LV and mild RV dysfunction; possible small focal prior infarct and basal inferior wall  Echocardiogram 01/10/2020 EF <20, severe LVH, GR 1 DD, deep trabeculations consistent with noncompaction  Echocardiogram 10/13/2019 EF 25-30, no LVH, grade 1 diastolic dysfunction (impaired relaxation), inferior septum, mid and apical anterior septum and mid inferior segment akinetic  Echocardiogram 2019 [facility]) Notes indicate EF = 35  Echocardiogram 05/17/2004 EF 40-45  Echocardiogram 08/19/2002 EF 40  History of Present Illness:   [name] was last seen in 1/21.  His echocardiogram demonstrated EF <20.  He was referred to Dr. Gala Romney in the heart failure clinic.  Cardiac MRI confirmed EF 30%.  There was LGE in the inferior wall suggestive of a small inferior infarct.  There was hypercoagulation consistent with noncompaction cardiomyopathy.  He was seen by Dr. Jomarie Longs with the genetics clinic.  It was felt that his noncompaction cardiomyopathy was likely a sporadic anatomic variant and genetic testing was not indicated.  He was seen by Dr. Graciela Husbands for EP and ultimately underwent ICD for primary prevention.  He is seen for follow-up.  He is overall doing well.  He has not had chest pain, shortness of breath, syncope,  ICD shock, orthopnea, leg edema. He has a lot of issues with depression and would like to try ketamine infusions, but they are too expensive and insurance will not cover.    Past Medical History:  Diagnosis Date  . Anxiety   . Chronic systolic CHF 05/09/2019    Echocardiogram 12/2019: EF < 20, severe LVH, Gr 1 DD, trabeculations c/w LV non-compaction  . Depression   . Diabetes mellitus, type 2 (HCC)   . GERD (gastroesophageal reflux disease)   . Hyperlipidemia   . Hypertension   . Hypertrophic obstructive cardiomyopathy   . Insomnia   . Left ventricular dysfunction    EF 35%  . Personality disorder (HCC)   . PTSD (post-traumatic stress disorder)   . Sleep apnea    Past Surgical History:  Procedure Laterality Date  . cardiac catherization    . ICD IMPLANT N/A 06/13/2020   Procedure: ICD IMPLANT;  Surgeon: Duke Salvia, MD;  Location: [facility] INVASIVE CV LAB;  Service: Cardiovascular;  Laterality: N/A;  . TONSILLECTOMY       Current Meds  Medication Sig  . acetaminophen (TYLENOL) 500 MG tablet Take 1,000 mg by mouth every 6 (six) hours as needed.  Marland Kitchen aspirin EC 81 MG tablet Take 81 mg by mouth daily.  Marland Kitchen atorvastatin (LIPITOR) 20 MG tablet Take 1 tablet (20 mg total) by mouth daily.  Marland Kitchen buPROPion (WELLBUTRIN XL) 300 MG 24 hr tablet Take 300 mg by mouth daily.   Marland Kitchen FLUoxetine (PROZAC) 20 MG capsule Take 80 mg by mouth daily.  . folic acid (FOLVITE) 400 MCG tablet Take 400 mcg by mouth daily.  Marland Kitchen glipiZIDE (GLUCOTROL) 10 MG tablet Take 10 mg by mouth 2 (two) times daily before a meal.   . melatonin 5 MG TABS Take 5 mg by mouth at bedtime as needed (sleep).  . metFORMIN (GLUCOPHAGE) 1000 MG tablet Take 1,000 mg by mouth 2 (two) times daily with a meal.   . Multiple Vitamin (MULTIVITAMIN) capsule Take 1 capsule by mouth daily.  Marland Kitchen omeprazole (PRILOSEC) 20 MG capsule Take 20 mg by mouth daily.  . [DISCONTINUED] carvedilol (COREG) 25 MG tablet Take 1 tablet (25 mg total) by mouth 2 (two) times daily with a meal.  . [DISCONTINUED] FARXIGA 10 MG TABS tablet TAKE 1 TABLET (10 MG TOTAL) BY MOUTH DAILY.  . [DISCONTINUED] sacubitril-valsartan (ENTRESTO) 97-103 MG Take 1 tablet by mouth 2 (two) times daily. Needs appt for future refills  . [DISCONTINUED]  spironolactone (ALDACTONE) 25 MG tablet Take 25 mg by mouth daily.   Current Facility-Administered Medications for the 01/16/21 encounter (Telemedicine) with Tereso Newcomer T, PA-C  Medication  . 0.9 %  sodium chloride infusion     Allergies:   Patient has no known allergies.   Social History   Tobacco Use  . Smoking status: Never Smoker  . Smokeless tobacco: Never Used  Substance Use Topics  . Alcohol use: Yes    Comment: rare  . Drug use: No     Family Hx: The patient's family history includes Colon cancer in his maternal grandfather; Colon cancer (age of onset: 18) in his brother; Heart attack in an other family member; Heart disease in his father; Hyperlipidemia in his father; Hypertension in his father and mother; Stroke in his mother. There is no history of Esophageal cancer, Rectal cancer, or Stomach cancer.  ROS:   Please see the history of present illness.      Labs/Other Tests and Data Reviewed:  EKG:  No ECG reviewed.  Recent Labs: 06/11/2020: BUN 12; Creatinine, Ser 1.13; Hemoglobin 15.7; Platelets 327; Potassium 4.0; Sodium 139   Recent Lipid Panel No results found for: CHOL, TRIG, HDL, CHOLHDL, LDLCALC, LDLDIRECT  Wt Readings from Last 3 Encounters:  01/16/21 270 lb (122.5 kg)  09/18/20 279 lb (126.6 kg)  06/13/20 285 lb (129.3 kg)     Risk Assessment/Calculations:      Objective:    Vital Signs:  BP 116/80   Ht 6\' 1"  (1.854 m)   Wt 270 lb (122.5 kg)   BMI 35.62 kg/m    VITAL SIGNS:  reviewed GEN:  no acute distress PSYCH:  normal affect  ASSESSMENT & PLAN:    1. HFrEF (heart failure with reduced ejection fraction) (HCC) 2. Noncompaction cardiomyopathy (HCC) NYHA I-II.  Volume status stable.  He is on an excellent medical regimen with ARNI, beta-blocker, MRA and SGLT2i.  Continue current Rx and f/u in 6 mos.   3. ICD (implantable cardioverter-defibrillator) in place F/u with EP as planned.   4. Essential hypertension The patient's blood  pressure is controlled on his current regimen.  Continue current therapy.   5. Type 2 diabetes mellitus with complication, without long-term current use of insulin (HCC) F/u with PCP as planned.   Time:   Today, I have spent 11 minutes with the patient with telehealth technology discussing the above problems.     Medication Adjustments/Labs and Tests Ordered: Current medicines are reviewed at length with the patient today.  Concerns regarding medicines are outlined above.   Tests Ordered: No orders of the defined types were placed in this encounter.   Medication Changes: Meds ordered this encounter  Medications  . carvedilol (COREG) 25 MG tablet    Sig: Take 1 tablet (25 mg total) by mouth 2 (two) times daily with a meal.    Dispense:  180 tablet    Refill:  3  . dapagliflozin propanediol (FARXIGA) 10 MG TABS tablet    Sig: Take 1 tablet (10 mg total) by mouth daily.    Dispense:  90 tablet    Refill:  3    Maximum Refills Reached  . spironolactone (ALDACTONE) 25 MG tablet    Sig: Take 1 tablet (25 mg total) by mouth daily.    Dispense:  90 tablet    Refill:  3  . sacubitril-valsartan (ENTRESTO) 97-103 MG    Sig: Take 1 tablet by mouth 2 (two) times daily. Needs appt for future refills    Dispense:  180 tablet    Refill:  3    Maximum Refills Reached    Follow Up:  In Person in 1 year(s)  Signed, , PA-C  01/16/2021 4:31 PM    [facility]

## 2021-01-16 ENCOUNTER — Encounter: Payer: Self-pay | Admitting: Physician Assistant

## 2021-01-16 ENCOUNTER — Telehealth (INDEPENDENT_AMBULATORY_CARE_PROVIDER_SITE_OTHER): Payer: Medicaid Other | Admitting: Physician Assistant

## 2021-01-16 ENCOUNTER — Other Ambulatory Visit: Payer: Self-pay

## 2021-01-16 VITALS — BP 116/80 | Ht 73.0 in | Wt 270.0 lb

## 2021-01-16 DIAGNOSIS — I5020 Unspecified systolic (congestive) heart failure: Secondary | ICD-10-CM | POA: Diagnosis not present

## 2021-01-16 DIAGNOSIS — Z95810 Presence of automatic (implantable) cardiac defibrillator: Secondary | ICD-10-CM

## 2021-01-16 DIAGNOSIS — I10 Essential (primary) hypertension: Secondary | ICD-10-CM | POA: Diagnosis not present

## 2021-01-16 DIAGNOSIS — I428 Other cardiomyopathies: Secondary | ICD-10-CM | POA: Diagnosis not present

## 2021-01-16 DIAGNOSIS — E118 Type 2 diabetes mellitus with unspecified complications: Secondary | ICD-10-CM

## 2021-01-16 MED ORDER — ENTRESTO 97-103 MG PO TABS: 1.0000 | ORAL_TABLET | Freq: Two times a day (BID) | ORAL | 3 refills | Status: DC

## 2021-01-16 MED ORDER — DAPAGLIFLOZIN PROPANEDIOL 10 MG PO TABS: 10.0000 mg | ORAL_TABLET | Freq: Every day | ORAL | 3 refills | Status: DC

## 2021-01-16 MED ORDER — CARVEDILOL 25 MG PO TABS: 25.0000 mg | ORAL_TABLET | Freq: Two times a day (BID) | ORAL | 3 refills | Status: DC

## 2021-01-16 MED ORDER — SPIRONOLACTONE 25 MG PO TABS: 25.0000 mg | ORAL_TABLET | Freq: Every day | ORAL | 3 refills | Status: DC

## 2021-01-16 NOTE — Patient Instructions (Signed)
Medication Instructions:  Your physician recommends that you continue on your current medications as directed. Please refer to the Current Medication list given to you today.  *If you need a refill on your cardiac medications before your next appointment, please call your pharmacy*  Lab Work: None ordered today  Testing/Procedures: None ordered today  Follow-Up: At [facility], you and your health needs are our priority.  As part of our continuing mission to provide you with exceptional heart care, we have created designated Provider Care Teams.  These Care Teams include your primary Cardiologist (physician) and Advanced Practice Providers (APPs -  Physician Assistants and Nurse Practitioners) who all work together to provide you with the care you need, when you need it.  Your next appointment:   6 month(s)  The format for your next appointment:   In Person  Provider:   Philip Nahser, MD    

## 2021-02-25 ENCOUNTER — Other Ambulatory Visit: Payer: Self-pay | Admitting: Cardiovascular Disease

## 2021-03-14 ENCOUNTER — Ambulatory Visit (INDEPENDENT_AMBULATORY_CARE_PROVIDER_SITE_OTHER): Payer: Medicaid Other

## 2021-03-14 DIAGNOSIS — I428 Other cardiomyopathies: Secondary | ICD-10-CM | POA: Diagnosis not present

## 2021-03-16 LAB — CUP PACEART REMOTE DEVICE CHECK
Battery Remaining Longevity: 150 mo
Battery Remaining Percentage: 100 %
Brady Statistic RV Percent Paced: 0 %
Date Time Interrogation Session: 20220324051500
HighPow Impedance: 79 Ohm
Implantable Lead Implant Date: 20210623
Implantable Lead Location: 753860
Implantable Lead Model: 138
Implantable Lead Serial Number: 302960
Implantable Pulse Generator Implant Date: 20210623
Lead Channel Impedance Value: 636 Ohm
Lead Channel Setting Pacing Amplitude: 2.5 V
Lead Channel Setting Pacing Pulse Width: 0.4 ms
Lead Channel Setting Sensing Sensitivity: 0.5 mV
Pulse Gen Serial Number: 205833

## 2021-03-25 NOTE — Progress Notes (Signed)
Remote ICD transmission.   

## 2021-04-05 ENCOUNTER — Other Ambulatory Visit (HOSPITAL_COMMUNITY): Payer: Self-pay

## 2021-06-13 ENCOUNTER — Ambulatory Visit (INDEPENDENT_AMBULATORY_CARE_PROVIDER_SITE_OTHER): Payer: Medicaid Other

## 2021-06-13 DIAGNOSIS — I428 Other cardiomyopathies: Secondary | ICD-10-CM | POA: Diagnosis not present

## 2021-06-14 LAB — CUP PACEART REMOTE DEVICE CHECK
Battery Remaining Longevity: 156 mo
Battery Remaining Percentage: 100 %
Brady Statistic RV Percent Paced: 0 %
Date Time Interrogation Session: 20220624111700
HighPow Impedance: 81 Ohm
Implantable Lead Implant Date: 20210623
Implantable Lead Location: 753860
Implantable Lead Model: 138
Implantable Lead Serial Number: 302960
Implantable Pulse Generator Implant Date: 20210623
Lead Channel Impedance Value: 625 Ohm
Lead Channel Setting Pacing Amplitude: 2.5 V
Lead Channel Setting Pacing Pulse Width: 0.4 ms
Lead Channel Setting Sensing Sensitivity: 0.5 mV
Pulse Gen Serial Number: 205833

## 2021-07-02 NOTE — Progress Notes (Signed)
Remote ICD transmission.   

## 2021-07-04 ENCOUNTER — Telehealth: Payer: Self-pay

## 2021-07-04 NOTE — Telephone Encounter (Signed)
**Note De-Identified Richard [name] Obfuscation** I called [employer] anddid a [place] PA over the phone with Lupita Leash. Per Lupita Leash we can call them back at [phone] in 24 hours to get their determination. PA #: [id number]

## 2021-09-12 ENCOUNTER — Ambulatory Visit (INDEPENDENT_AMBULATORY_CARE_PROVIDER_SITE_OTHER): Payer: Medicaid Other

## 2021-09-12 DIAGNOSIS — I428 Other cardiomyopathies: Secondary | ICD-10-CM

## 2021-09-12 LAB — CUP PACEART REMOTE DEVICE CHECK
Battery Remaining Longevity: 138 mo
Battery Remaining Percentage: 98 %
Brady Statistic RV Percent Paced: 0 %
Date Time Interrogation Session: 20220922050100
HighPow Impedance: 79 Ohm
Implantable Lead Implant Date: 20210623
Implantable Lead Location: 753860
Implantable Lead Model: 138
Implantable Lead Serial Number: 302960
Implantable Pulse Generator Implant Date: 20210623
Lead Channel Impedance Value: 611 Ohm
Lead Channel Setting Pacing Amplitude: 2.5 V
Lead Channel Setting Pacing Pulse Width: 0.4 ms
Lead Channel Setting Sensing Sensitivity: 0.5 mV
Pulse Gen Serial Number: 205833

## 2021-09-18 NOTE — Progress Notes (Signed)
Remote ICD transmission.   

## 2021-10-08 ENCOUNTER — Other Ambulatory Visit: Payer: Self-pay | Admitting: *Deleted

## 2021-10-08 MED ORDER — CARVEDILOL 25 MG PO TABS: 25.0000 mg | ORAL_TABLET | Freq: Two times a day (BID) | ORAL | 1 refills | Status: DC

## 2021-12-11 ENCOUNTER — Telehealth: Payer: Self-pay

## 2021-12-11 NOTE — Telephone Encounter (Signed)
**Note De-Identified Nichola Warren Obfuscation** Marcelline Deist PA started through covermymeds. Key: [id number]

## 2021-12-12 ENCOUNTER — Ambulatory Visit (INDEPENDENT_AMBULATORY_CARE_PROVIDER_SITE_OTHER): Payer: Medicaid Other

## 2021-12-12 DIAGNOSIS — I428 Other cardiomyopathies: Secondary | ICD-10-CM | POA: Diagnosis not present

## 2021-12-12 LAB — CUP PACEART REMOTE DEVICE CHECK
Battery Remaining Longevity: 132 mo
Battery Remaining Percentage: 94 %
Brady Statistic RV Percent Paced: 0 %
Date Time Interrogation Session: 20221222050100
HighPow Impedance: 77 Ohm
Implantable Lead Implant Date: 20210623
Implantable Lead Location: 753860
Implantable Lead Model: 138
Implantable Lead Serial Number: 302960
Implantable Pulse Generator Implant Date: 20210623
Lead Channel Impedance Value: 586 Ohm
Lead Channel Setting Pacing Amplitude: 2.5 V
Lead Channel Setting Pacing Pulse Width: 0.4 ms
Lead Channel Setting Sensing Sensitivity: 0.5 mV
Pulse Gen Serial Number: 205833

## 2021-12-20 NOTE — Progress Notes (Signed)
Remote ICD transmission.   

## 2022-02-18 ENCOUNTER — Encounter: Payer: Self-pay | Admitting: Gastroenterology

## 2022-03-13 ENCOUNTER — Ambulatory Visit (INDEPENDENT_AMBULATORY_CARE_PROVIDER_SITE_OTHER): Payer: Medicaid Other

## 2022-03-13 DIAGNOSIS — I428 Other cardiomyopathies: Secondary | ICD-10-CM

## 2022-03-13 DIAGNOSIS — I5022 Chronic systolic (congestive) heart failure: Secondary | ICD-10-CM

## 2022-03-13 LAB — CUP PACEART REMOTE DEVICE CHECK
Battery Remaining Longevity: 138 mo
Battery Remaining Percentage: 96 %
Brady Statistic RV Percent Paced: 0 %
Date Time Interrogation Session: 20230323050100
HighPow Impedance: 82 Ohm
Implantable Lead Implant Date: 20210623
Implantable Lead Location: 753860
Implantable Lead Model: 138
Implantable Lead Serial Number: 302960
Implantable Pulse Generator Implant Date: 20210623
Lead Channel Impedance Value: 603 Ohm
Lead Channel Setting Pacing Amplitude: 2.5 V
Lead Channel Setting Pacing Pulse Width: 0.4 ms
Lead Channel Setting Sensing Sensitivity: 0.5 mV
Pulse Gen Serial Number: 205833

## 2022-03-19 NOTE — Progress Notes (Signed)
Remote ICD transmission.   

## 2022-06-04 ENCOUNTER — Telehealth: Payer: Self-pay | Admitting: Cardiovascular Disease

## 2022-06-04 NOTE — Telephone Encounter (Signed)
Patient states he recently became disable, he can get a tax relief if a doctor signs it. He wants to know if Dr. Acie Fredrickson will sign it for him.

## 2022-06-06 NOTE — Telephone Encounter (Signed)
Called and spoke to patient. He states that he has an appt on Monday here and will take it up with Dr Elease Hashimoto then. He states that the paperwork for his disability was declared by a judge at the social security office and no physician signature has been required.

## 2022-06-08 ENCOUNTER — Encounter: Payer: Self-pay | Admitting: Cardiovascular Disease

## 2022-06-08 NOTE — Progress Notes (Unsigned)
Virtual Visit via Video Note   This visit type was conducted due to national recommendations for restrictions regarding the COVID-19 Pandemic (e.g. social distancing) in an effort to limit this patient's exposure and mitigate transmission in our community.  Due to his co-morbid illnesses, this patient is at least at moderate risk for complications without adequate follow up.  This format is felt to be most appropriate for this patient at this time.  All issues noted in this document were discussed and addressed.  A limited physical exam was performed with this format.  Please refer to the patient's chart for his consent to telehealth for [facility].   Date:  06/08/2022   ID:  Richard [name], DOB 1961-05-25, MRN [id number]  Patient Location: Home Provider Location: Office  PCP:  Marlyn Corporal, PA  Cardiologist:  Kristeen Miss, MD  Electrophysiologist:  None   Evaluation Performed:  Follow-Up Visit  Chief Complaint:  CHF   History of Present Illness:    Richard [name] is a 61 y.o. male with CHF.  I saw Richard [name] many years ago for CHF.  Was lost to follow-up for many years.  He returned last year with persistent CHF symptoms.  Echocardiogram performed last week reveals an ejection fraction of less than 20%.  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 We have referred him to the advanced CHF clinic and his appointment is in early March.  Still asymptomatic .  Is exercising much.   Tolerating the coreg and entresto .  Admits that he hasnt previously been taking his meds exactily as prescribed . Has been very compliant for the past 2 weeks.   Before that he was only minimally compliant. ( could not remember whether or not he took the pill )   June 09, 2022  Richard [name] is seen today for follow  up visit for his CHF Has been seen in the Advanced CHF clinic     Past Medical History:  Diagnosis Date   Anxiety    Chronic systolic CHF 05/09/2019    Echocardiogram 12/2019: EF < 20, severe LVH, Gr 1 DD, trabeculations c/w LV non-compaction   Depression    Diabetes mellitus, type 2 (HCC)    GERD (gastroesophageal reflux disease)    Hyperlipidemia    Hypertension    Hypertrophic obstructive cardiomyopathy    Insomnia    Left ventricular dysfunction    EF 35%   Personality disorder (HCC)    PTSD (post-traumatic stress disorder)    Sleep apnea    Past Surgical History:  Procedure Laterality Date   cardiac catherization     ICD IMPLANT N/A 06/13/2020   Procedure: ICD IMPLANT;  Surgeon: Duke Salvia, MD;  Location: [facility] INVASIVE CV LAB;  Service: Cardiovascular;  Laterality: N/A;   TONSILLECTOMY       No outpatient medications have been marked as taking for the 06/09/22 encounter (Office Visit) with Elease Hashimoto, Deloris Ping, MD.   Current Facility-Administered Medications for the 06/09/22 encounter (Office Visit) with Jazalyn Mondor, Deloris Ping, MD  Medication   0.9 %  sodium chloride infusion     Allergies:   Patient has no known allergies.   Social History   Tobacco Use   Smoking status: Never   Smokeless tobacco: Never  Substance Use Topics   Alcohol use: Yes    Comment: rare   Drug use: No     Family Hx: The patient's family history includes Colon cancer in his maternal grandfather; Colon cancer (  age of onset: 8) in his brother; Heart attack in an other family member; Heart disease in his father; Hyperlipidemia in his father; Hypertension in his father and mother; Stroke in his mother. There is no history of Esophageal cancer, Rectal cancer, or Stomach cancer.  ROS:   Please see the history of present illness.     All other systems reviewed and are negative.   Prior CV studies:   The following studies were reviewed today:    Labs/Other Tests and Data Reviewed:    EKG:  No ECG reviewed.  Recent Labs: No results found for requested labs within last 365 days.   Recent Lipid Panel No results found for: "CHOL", "TRIG", "HDL",  "CHOLHDL", "LDLCALC", "LDLDIRECT"  Wt Readings from Last 3 Encounters:  01/16/21 270 lb (122.5 kg)  09/18/20 279 lb (126.6 kg)  06/13/20 285 lb (129.3 kg)     Objective:    Physical Exam: There were no vitals taken for this visit.  GEN:  Well nourished, well developed in no acute distress HEENT: Normal NECK: No JVD; No carotid bruits LYMPHATICS: No lymphadenopathy CARDIAC: RRR ***, no murmurs, rubs, gallops RESPIRATORY:  Clear to auscultation without rales, wheezing or rhonchi  ABDOMEN: Soft, non-tender, non-distended MUSCULOSKELETAL:  No edema; No deformity  SKIN: Warm and dry NEUROLOGIC:  Alert and oriented x 3   ASSESSMENT & PLAN:     CHF:         Medication Adjustments/Labs and Tests Ordered: Current medicines are reviewed at length with the patient today.  Concerns regarding medicines are outlined above.   Tests Ordered: No orders of the defined types were placed in this encounter.   Medication Changes: No orders of the defined types were placed in this encounter.   Follow Up:  In Person in 2 month(s) with Dr. Gala Romney . I'll see him in 6 months   Signed, Kristeen Miss, MD  06/08/2022 8:55 PM    [facility]

## 2022-06-09 ENCOUNTER — Encounter: Payer: Self-pay | Admitting: Cardiovascular Disease

## 2022-06-09 ENCOUNTER — Ambulatory Visit: Payer: Medicaid Other | Admitting: Cardiovascular Disease

## 2022-06-09 VITALS — BP 126/84 | HR 81 | Ht 73.0 in | Wt 289.8 lb

## 2022-06-09 DIAGNOSIS — E118 Type 2 diabetes mellitus with unspecified complications: Secondary | ICD-10-CM | POA: Diagnosis not present

## 2022-06-09 DIAGNOSIS — I5022 Chronic systolic (congestive) heart failure: Secondary | ICD-10-CM | POA: Diagnosis not present

## 2022-06-09 MED ORDER — DAPAGLIFLOZIN PROPANEDIOL 10 MG PO TABS: 10.0000 mg | ORAL_TABLET | Freq: Every day | ORAL | 3 refills | Status: DC

## 2022-06-09 MED ORDER — SPIRONOLACTONE 25 MG PO TABS: 25.0000 mg | ORAL_TABLET | Freq: Every day | ORAL | 3 refills | Status: DC

## 2022-06-09 MED ORDER — ENTRESTO 97-103 MG PO TABS: 1.0000 | ORAL_TABLET | Freq: Two times a day (BID) | ORAL | 3 refills | Status: DC

## 2022-06-09 MED ORDER — CARVEDILOL 25 MG PO TABS: 25.0000 mg | ORAL_TABLET | Freq: Two times a day (BID) | ORAL | 3 refills | Status: DC

## 2022-06-09 MED ORDER — ATORVASTATIN CALCIUM 20 MG PO TABS: 20.0000 mg | ORAL_TABLET | Freq: Every day | ORAL | 3 refills | Status: AC

## 2022-06-09 NOTE — Patient Instructions (Signed)
Medication Instructions:  REFILLED Lipitor, Coreg, Entresto, Spironolactone, Marcelline Deist *If you need a refill on your cardiac medications before your next appointment, please call your pharmacy*   Lab Work: NONE If you have labs (blood work) drawn today and your tests are completely normal, you will receive your results only by: MyChart Message (if you have MyChart) OR A paper copy in the mail If you have any lab test that is abnormal or we need to change your treatment, we will call you to review the results.   Testing/Procedures: NONE   Follow-Up: At [facility], you and your health needs are our priority.  As part of our continuing mission to provide you with exceptional heart care, we have created designated Provider Care Teams.  These Care Teams include your primary Cardiologist (physician) and Advanced Practice Providers (APPs -  Physician Assistants and Nurse Practitioners) who all work together to provide you with the care you need, when you need it.  Your next appointment:   1 year(s)  The format for your next appointment:   In Person  Provider:   Suzzanne Cloud, or Nahser {    Important Information About Sugar

## 2022-06-12 ENCOUNTER — Ambulatory Visit (INDEPENDENT_AMBULATORY_CARE_PROVIDER_SITE_OTHER): Payer: Medicaid Other

## 2022-06-12 DIAGNOSIS — I428 Other cardiomyopathies: Secondary | ICD-10-CM

## 2022-06-12 LAB — CUP PACEART REMOTE DEVICE CHECK
Battery Remaining Longevity: 132 mo
Battery Remaining Percentage: 94 %
Brady Statistic RV Percent Paced: 0 %
Date Time Interrogation Session: 20230622050200
HighPow Impedance: 76 Ohm
Implantable Lead Implant Date: 20210623
Implantable Lead Location: 753860
Implantable Lead Model: 138
Implantable Lead Serial Number: 302960
Implantable Pulse Generator Implant Date: 20210623
Lead Channel Impedance Value: 568 Ohm
Lead Channel Setting Pacing Amplitude: 2.5 V
Lead Channel Setting Pacing Pulse Width: 0.4 ms
Lead Channel Setting Sensing Sensitivity: 0.5 mV
Pulse Gen Serial Number: 205833

## 2022-06-19 NOTE — Progress Notes (Signed)
Remote ICD transmission.   

## 2022-06-23 ENCOUNTER — Telehealth: Payer: Self-pay

## 2022-06-23 NOTE — Telephone Encounter (Signed)
P/A for Entresto 97-103mg  Tablets:   Outcome Approved-today PA Case: [id number], Status: Approved, Coverage Starts on: 06/23/2022 12:00:00 AM, Coverage Ends on: 06/23/2023 12:00:00 AM.  KEY: [id number]

## 2022-07-30 ENCOUNTER — Encounter (INDEPENDENT_AMBULATORY_CARE_PROVIDER_SITE_OTHER): Payer: Self-pay

## 2022-09-11 ENCOUNTER — Ambulatory Visit (INDEPENDENT_AMBULATORY_CARE_PROVIDER_SITE_OTHER): Payer: Medicaid Other

## 2022-09-11 DIAGNOSIS — I428 Other cardiomyopathies: Secondary | ICD-10-CM | POA: Diagnosis not present

## 2022-09-11 LAB — CUP PACEART REMOTE DEVICE CHECK
Battery Remaining Longevity: 132 mo
Battery Remaining Percentage: 93 %
Brady Statistic RV Percent Paced: 0 %
Date Time Interrogation Session: 20230921050200
HighPow Impedance: 87 Ohm
Implantable Lead Implant Date: 20210623
Implantable Lead Location: 753860
Implantable Lead Model: 138
Implantable Lead Serial Number: 302960
Implantable Pulse Generator Implant Date: 20210623
Lead Channel Impedance Value: 592 Ohm
Lead Channel Setting Pacing Amplitude: 2.5 V
Lead Channel Setting Pacing Pulse Width: 0.4 ms
Lead Channel Setting Sensing Sensitivity: 0.5 mV
Pulse Gen Serial Number: 205833

## 2022-09-22 NOTE — Progress Notes (Signed)
Remote ICD transmission.   

## 2022-12-11 ENCOUNTER — Ambulatory Visit (INDEPENDENT_AMBULATORY_CARE_PROVIDER_SITE_OTHER): Payer: Medicaid Other

## 2022-12-11 DIAGNOSIS — I428 Other cardiomyopathies: Secondary | ICD-10-CM

## 2022-12-11 LAB — CUP PACEART REMOTE DEVICE CHECK
Battery Remaining Longevity: 126 mo
Battery Remaining Percentage: 90 %
Brady Statistic RV Percent Paced: 0 %
Date Time Interrogation Session: 20231221051500
HighPow Impedance: 87 Ohm
Implantable Lead Connection Status: 753985
Implantable Lead Implant Date: 20210623
Implantable Lead Location: 753860
Implantable Lead Model: 138
Implantable Lead Serial Number: 302960
Implantable Pulse Generator Implant Date: 20210623
Lead Channel Impedance Value: 598 Ohm
Lead Channel Setting Pacing Amplitude: 2.5 V
Lead Channel Setting Pacing Pulse Width: 0.4 ms
Lead Channel Setting Sensing Sensitivity: 0.5 mV
Pulse Gen Serial Number: 205833
Zone Setting Status: 755011

## 2023-01-02 NOTE — Progress Notes (Signed)
Remote ICD transmission.   

## 2023-03-12 ENCOUNTER — Ambulatory Visit (INDEPENDENT_AMBULATORY_CARE_PROVIDER_SITE_OTHER): Payer: Medicaid Other

## 2023-03-12 DIAGNOSIS — I428 Other cardiomyopathies: Secondary | ICD-10-CM | POA: Diagnosis not present

## 2023-03-12 LAB — CUP PACEART REMOTE DEVICE CHECK
Battery Remaining Longevity: 126 mo
Battery Remaining Percentage: 86 %
Brady Statistic RV Percent Paced: 0 %
Date Time Interrogation Session: 20240321050200
HighPow Impedance: 96 Ohm
Implantable Lead Connection Status: 753985
Implantable Lead Implant Date: 20210623
Implantable Lead Location: 753860
Implantable Lead Model: 138
Implantable Lead Serial Number: 302960
Implantable Pulse Generator Implant Date: 20210623
Lead Channel Impedance Value: 612 Ohm
Lead Channel Setting Pacing Amplitude: 2.5 V
Lead Channel Setting Pacing Pulse Width: 0.4 ms
Lead Channel Setting Sensing Sensitivity: 0.5 mV
Pulse Gen Serial Number: 205833
Zone Setting Status: 755011

## 2023-04-15 NOTE — Progress Notes (Signed)
Remote ICD transmission.   

## 2023-06-11 ENCOUNTER — Ambulatory Visit (INDEPENDENT_AMBULATORY_CARE_PROVIDER_SITE_OTHER): Payer: Medicaid Other

## 2023-06-11 DIAGNOSIS — I428 Other cardiomyopathies: Secondary | ICD-10-CM

## 2023-06-11 LAB — CUP PACEART REMOTE DEVICE CHECK
Battery Remaining Longevity: 120 mo
Battery Remaining Percentage: 86 %
Brady Statistic RV Percent Paced: 0 %
Date Time Interrogation Session: 20240620050100
HighPow Impedance: 88 Ohm
Implantable Lead Connection Status: 753985
Implantable Lead Implant Date: 20210623
Implantable Lead Location: 753860
Implantable Lead Model: 138
Implantable Lead Serial Number: 302960
Implantable Pulse Generator Implant Date: 20210623
Lead Channel Impedance Value: 591 Ohm
Lead Channel Setting Pacing Amplitude: 2.5 V
Lead Channel Setting Pacing Pulse Width: 0.4 ms
Lead Channel Setting Sensing Sensitivity: 0.5 mV
Pulse Gen Serial Number: 205833
Zone Setting Status: 755011

## 2023-07-01 NOTE — Progress Notes (Signed)
Remote ICD transmission.   

## 2023-09-10 ENCOUNTER — Ambulatory Visit (INDEPENDENT_AMBULATORY_CARE_PROVIDER_SITE_OTHER): Payer: Medicaid Other

## 2023-09-10 DIAGNOSIS — I428 Other cardiomyopathies: Secondary | ICD-10-CM | POA: Diagnosis not present

## 2023-09-10 LAB — CUP PACEART REMOTE DEVICE CHECK
Battery Remaining Longevity: 114 mo
Battery Remaining Percentage: 82 %
Brady Statistic RV Percent Paced: 0 %
Date Time Interrogation Session: 20240919054100
HighPow Impedance: 81 Ohm
Implantable Lead Connection Status: 753985
Implantable Lead Implant Date: 20210623
Implantable Lead Location: 753860
Implantable Lead Model: 138
Implantable Lead Serial Number: 302960
Implantable Pulse Generator Implant Date: 20210623
Lead Channel Impedance Value: 572 Ohm
Lead Channel Setting Pacing Amplitude: 2.5 V
Lead Channel Setting Pacing Pulse Width: 0.4 ms
Lead Channel Setting Sensing Sensitivity: 0.5 mV
Pulse Gen Serial Number: 205833
Zone Setting Status: 755011

## 2023-09-21 NOTE — Progress Notes (Signed)
Remote ICD transmission.   

## 2023-12-10 ENCOUNTER — Ambulatory Visit (INDEPENDENT_AMBULATORY_CARE_PROVIDER_SITE_OTHER): Payer: Self-pay

## 2023-12-10 DIAGNOSIS — I428 Other cardiomyopathies: Secondary | ICD-10-CM

## 2023-12-10 LAB — CUP PACEART REMOTE DEVICE CHECK
Battery Remaining Longevity: 114 mo
Battery Remaining Percentage: 77 %
Brady Statistic RV Percent Paced: 0 %
Date Time Interrogation Session: 20241219052700
HighPow Impedance: 79 Ohm
Implantable Lead Connection Status: 753985
Implantable Lead Implant Date: 20210623
Implantable Lead Location: 753860
Implantable Lead Model: 138
Implantable Lead Serial Number: 302960
Implantable Pulse Generator Implant Date: 20210623
Lead Channel Impedance Value: 572 Ohm
Lead Channel Setting Pacing Amplitude: 2.5 V
Lead Channel Setting Pacing Pulse Width: 0.4 ms
Lead Channel Setting Sensing Sensitivity: 0.5 mV
Pulse Gen Serial Number: 205833
Zone Setting Status: 755011

## 2023-12-28 ENCOUNTER — Encounter: Payer: Medicaid Other | Admitting: Internal Medicine

## 2024-01-04 ENCOUNTER — Encounter: Payer: Self-pay | Admitting: Internal Medicine

## 2024-01-04 ENCOUNTER — Ambulatory Visit: Payer: Medicaid Other | Attending: Internal Medicine | Admitting: Internal Medicine

## 2024-01-04 VITALS — BP 146/90 | HR 113 | Ht 73.0 in | Wt 266.0 lb

## 2024-01-04 DIAGNOSIS — I5022 Chronic systolic (congestive) heart failure: Secondary | ICD-10-CM

## 2024-01-04 DIAGNOSIS — Z79899 Other long term (current) drug therapy: Secondary | ICD-10-CM

## 2024-01-04 DIAGNOSIS — Z95810 Presence of automatic (implantable) cardiac defibrillator: Secondary | ICD-10-CM

## 2024-01-04 DIAGNOSIS — I428 Other cardiomyopathies: Secondary | ICD-10-CM

## 2024-01-04 MED ORDER — DAPAGLIFLOZIN PROPANEDIOL 10 MG PO TABS: 10.0000 mg | ORAL_TABLET | Freq: Every day | ORAL | 2 refills | Status: AC

## 2024-01-04 MED ORDER — ENTRESTO 97-103 MG PO TABS: 1.0000 | ORAL_TABLET | Freq: Two times a day (BID) | ORAL | 2 refills | Status: DC

## 2024-01-04 MED ORDER — CARVEDILOL 25 MG PO TABS: 25.0000 mg | ORAL_TABLET | Freq: Two times a day (BID) | ORAL | 2 refills | Status: DC

## 2024-01-04 MED ORDER — SPIRONOLACTONE 25 MG PO TABS: 25.0000 mg | ORAL_TABLET | Freq: Every day | ORAL | 2 refills | Status: DC

## 2024-01-04 NOTE — Progress Notes (Signed)
 Patient Care Team: Barbarann Mallie DEL, PA as PCP - General (Family Medicine) Nahser, Aleene PARAS, MD as PCP - Cardiology (Cardiology)   HPI  Richard [name] is a 63 y.o. male  Seen in followup for [facility] [name]   ICD implanted 6/21 for primary prevention for NICM and possible LV [name]   Is been years since he has been seen, he ascribes it to anxiety and shame related to having run out of his medications and not wanting to confront that  The patient denies chest pain, shortness of breath, nocturnal dyspnea, orthopnea or peripheral edema.  There have been no palpitations, lightheadedness or syncope.   Has not seen PCP in years  Hx of OSA  underwenthyoid suspension at [name]     Family history of coronary disease but no known cardiomyopathy.    DATE TEST EF    2002 LHC   Cors-- w/o obstruction   8/03 Echo   40 %    10/20 Echo   20-25 %    1/21 Echo  <20%  trabeculations concerning for LV [place]  4/21 cMRI  30%  Hypertrabeculations ratio 4.6:1 +LGE     Date Cr K Hgb  2/21 1.07 4.6     6/21 1.13 4.0 15.7       Records and Results Reviewed   Past Medical History:  Diagnosis Date   Anxiety    Chronic systolic CHF 05/09/2019   Echocardiogram 12/2019: EF < 20, severe LVH, Gr 1 DD, trabeculations c/w LV non-compaction   Depression    Diabetes mellitus, type 2 (HCC)    GERD (gastroesophageal reflux disease)    Hyperlipidemia    Hypertension    Hypertrophic obstructive cardiomyopathy    Insomnia    Left ventricular dysfunction    EF 35%   Personality disorder (HCC)    PTSD (post-traumatic stress disorder)    Sleep apnea     Past Surgical History:  Procedure Laterality Date   cardiac catherization     ICD IMPLANT N/A 06/13/2020   Procedure: ICD IMPLANT;  Surgeon: Richard Elspeth BROCKS, MD;  Location: [name] INVASIVE CV LAB;  Service: Cardiovascular;  Laterality: N/A;   TONSILLECTOMY      Current Meds  Medication Sig   MAGNESIUM PO Take 1,000 mg by mouth daily in the  afternoon. Magnesium tarate   Current Facility-Administered Medications for the 01/04/24 encounter (Office Visit) with Richard Elspeth BROCKS, MD  Medication   0.9 %  sodium chloride  infusion    No Known Allergies    Review of Systems negative except from HPI and PMH  Physical Exam BP (!) 146/90   Pulse (!) 113   Ht 6' 1 (1.854 m)   Wt 266 lb (120.7 kg)   SpO2 96%   BMI 35.09 kg/m  Well developed and well nourished in no acute distress HENT normal Neck supple with JVP-flat Clear Device pocket well healed; without hematoma or erythema.  There is no tethering  Regular rate and rhythm, no  murmur Abd-soft with active BS No Clubbing cyanosis  edema Skin-warm and dry A & Oriented  Grossly normal sensory and motor function  ECG sinus at 113 Interval 17/11/33 Inferior wall MI Biphasic P wave V1 and upright P wave that is bifid in lead II   Device function is normal. Programming changes none  See Paceart for details    CrCl cannot be calculated (Patient's most recent lab result is older than the maximum 21 days allowed.).  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  Sinus tachycardia seems to be related to the anxiety of being here as his average heart rate over the last 24 hours has been in the 90s; there has been a gradual increase in his average heart rate over the last 6 months from the 80s to the 90s.  He has not been taking his medications regularly for some period of time he does not know whether it been more than 6 months  Will resume him on his carvedilol  started 12.5 twice daily, Entresto  49/51 Farxiga  10 spironolactone   25.  He will need follow-up blood work in about 2 weeks to reassess potassium levels following the initiation of the spironolactone .  Will have him seen in about 3 months with [employer]  APP to follow-up his medications   Enocuraged him to discuss with PCP his OSA and needs I think to repeat sleep study       Current medicines are reviewed at length with the patient today .  The patient does not  have concerns regarding medicines.

## 2024-01-04 NOTE — Patient Instructions (Addendum)
 Medication Instructions:  Your physician recommends that you continue on your current medications as directed. Please refer to the Current Medication list given to you today.  *If you need a refill on your cardiac medications before your next appointment, please call your pharmacy*   Lab Work: Your physician recommends that you return for lab work in: 2 weeks for BMET  (lab is located on the 1st floor of our building)  If you have labs (blood work) drawn today and your tests are completely normal, you will receive your results only by: MyChart Message (if you have MyChart) OR A paper copy in the mail If you have any lab test that is abnormal or we need to change your treatment, we will call you to review the results.   Testing/Procedures: None ordered   Follow-Up: At [facility], you and your health needs are our priority.  As part of our continuing mission to provide you with exceptional heart care, we have created designated Provider Care Teams.  These Care Teams include your primary Cardiologist (physician) and Advanced Practice Providers (APPs -  Physician Assistants and Nurse Practitioners) who all work together to provide you with the care you need, when you need it.  Your next appointment:   3 month(s)  The format for your next appointment:   In Person  Provider:   APP on Dr. Allena team.   Once you see them they will get you scheduled to establish with a new cardiologist since Dr. Alveta is retiring. APP{   Your physician wants you to follow-up in: 1 year with Dr. Celine team. Rosine will receive a reminder letter in the mail two months in advance. If you don't receive a letter, please call our office to schedule the follow-up appointment.    Thank you for choosing [facility]!!   Maeola Domino, RN [phone]  Other Instructions

## 2024-01-11 LAB — CUP PACEART INCLINIC DEVICE CHECK
Date Time Interrogation Session: 20250113165837
HighPow Impedance: 89 Ohm
Implantable Lead Connection Status: 753985
Implantable Lead Implant Date: 20210623
Implantable Lead Location: 753860
Implantable Lead Model: 138
Implantable Lead Serial Number: 302960
Implantable Pulse Generator Implant Date: 20210623
Lead Channel Impedance Value: 581 Ohm
Lead Channel Sensing Intrinsic Amplitude: 25 mV
Lead Channel Setting Pacing Amplitude: 2.5 V
Lead Channel Setting Pacing Pulse Width: 0.4 ms
Lead Channel Setting Sensing Sensitivity: 0.5 mV
Pulse Gen Serial Number: 205833
Zone Setting Status: 755011

## 2024-01-12 NOTE — Progress Notes (Signed)
Remote ICD transmission.   

## 2024-01-21 ENCOUNTER — Other Ambulatory Visit (HOSPITAL_COMMUNITY): Payer: Self-pay

## 2024-01-21 ENCOUNTER — Telehealth: Payer: Self-pay | Admitting: Pharmacy Technician

## 2024-01-21 NOTE — Telephone Encounter (Signed)
Pharmacy Patient Advocate Encounter  Received notification from [facility] that Prior Authorization for farxiga has been APPROVED from 01/21/24 to 01/20/25. Ran test claim, Copay is $4.00 -3 months. This test claim was processed through [facility]- copay amounts may vary at other pharmacies due to pharmacy/plan contracts, or as the patient moves through the different stages of their insurance plan.   PA #/Case ID/Reference #: [id number]

## 2024-01-21 NOTE — Telephone Encounter (Signed)
Pharmacy Patient Advocate Encounter   Received notification from CoverMyMeds that prior authorization for farxiga is required/requested.   Insurance verification completed.   The patient is insured through [facility] .   Per test claim: PA required; PA submitted to above mentioned insurance via CoverMyMeds Key/confirmation #/EOC [facility] Status is pending

## 2024-02-17 ENCOUNTER — Encounter: Payer: Self-pay | Admitting: Internal Medicine

## 2024-03-10 ENCOUNTER — Ambulatory Visit (INDEPENDENT_AMBULATORY_CARE_PROVIDER_SITE_OTHER): Payer: Self-pay

## 2024-03-10 DIAGNOSIS — I428 Other cardiomyopathies: Secondary | ICD-10-CM

## 2024-03-11 LAB — CUP PACEART REMOTE DEVICE CHECK
Battery Remaining Longevity: 102 mo
Battery Remaining Percentage: 74 %
Brady Statistic RV Percent Paced: 0 %
Date Time Interrogation Session: 20250320050200
HighPow Impedance: 82 Ohm
Implantable Lead Connection Status: 753985
Implantable Lead Implant Date: 20210623
Implantable Lead Location: 753860
Implantable Lead Model: 138
Implantable Lead Serial Number: 302960
Implantable Pulse Generator Implant Date: 20210623
Lead Channel Impedance Value: 580 Ohm
Lead Channel Setting Pacing Amplitude: 2.5 V
Lead Channel Setting Pacing Pulse Width: 0.4 ms
Lead Channel Setting Sensing Sensitivity: 0.5 mV
Pulse Gen Serial Number: 205833
Zone Setting Status: 755011

## 2024-04-02 ENCOUNTER — Encounter: Payer: Self-pay | Admitting: Internal Medicine

## 2024-04-04 ENCOUNTER — Ambulatory Visit: Payer: Medicaid Other | Admitting: Nurse Practitioner

## 2024-04-18 ENCOUNTER — Ambulatory Visit: Admitting: Nurse Practitioner

## 2024-04-18 NOTE — Progress Notes (Deleted)
 Cardiology Office Note    Patient Name: Richard [name] Date of Encounter: 04/18/2024  Primary Care Provider:  Antionette Kirks, PA Primary Cardiologist:  Richard Alert, MD Primary Electrophysiologist: None   Past Medical History    Past Medical History:  Diagnosis Date   Anxiety    Chronic systolic CHF 05/09/2019   Echocardiogram 12/2019: EF < 20, severe LVH, Gr 1 DD, trabeculations c/w LV non-compaction   Depression    Diabetes mellitus, type 2 (HCC)    GERD (gastroesophageal reflux disease)    Hyperlipidemia    Hypertension    Hypertrophic obstructive cardiomyopathy    Insomnia    Left ventricular dysfunction    EF 35%   Personality disorder (HCC)    PTSD (post-traumatic stress disorder)    Sleep apnea     History of Present Illness  Richard [name] is a 63 y.o. male with a PMH of HOCM, DM type II, HTN, HLD, HFrEF, NICM PTSD, OSA (on CPAP), obesity, anxiety who presents today for 74-month follow-up.  Richard [name] was seen initially by Dr. Alroy [name] in 09/2019 to reestablish care after being followed at [facility].  He underwent a LHC in 2002 that showed normal coronaries with high output heart failure.  2D echo was completed at that time with EF of 25-30% and mild diastolic dysfunction.  He was started on Entresto  and had lisinopril  discontinued.  He was seen in follow-up by Richard Single, PA on 11/11/2019 and was noted to have stable volume with lightheadedness and no further titration was made at that time.  He underwent a limited echo and 12/2019 that showed EF less than 20%.  He was referred to the advanced heart failure clinic and seen by Dr. Julane [name].  He was noted to have deep LV trabeculations and underwent cardiac MR that showed transmural late gadolinium enhancement and was referred to EP for ICD placement.  He underwent placement of a [employer] [name]-chamber ICD on 06/13/2020.  He was last seen by Dr. Alroy [name] on 05/2022 and was compliant with medications.  He  was seen by Dr. Cecillia [name] on 01/04/2024 and reported not taking his medications due to anxiety.  He was resumed on carvedilol , Entresto , Farxiga  and spironolactone .    Patient denies chest pain, palpitations, dyspnea, PND, orthopnea, nausea, vomiting, dizziness, syncope, edema, weight gain, or early satiety.   Discussed the use of AI scribe software for clinical note transcription with the patient, who gave verbal consent to proceed.  History of Present Illness    ***Notes: -Last ischemic evaluation:  Review of Systems  Please see the history of present illness.    All other systems reviewed and are otherwise negative except as noted above.  Physical Exam    Wt Readings from Last 3 Encounters:  01/04/24 266 lb (120.7 kg)  06/09/22 289 lb 12.8 oz (131.5 kg)  01/16/21 270 lb (122.5 kg)   WJ:XBJYN were no vitals filed for this visit.,There is no height or weight on file to calculate BMI. GEN: Well nourished, well developed in no acute distress Neck: No JVD; No carotid bruits Pulmonary: Clear to auscultation without rales, wheezing or rhonchi  Cardiovascular: Normal rate. Regular rhythm. Normal S1. Normal S2.   Murmurs: There is no murmur.  ABDOMEN: Soft, non-tender, non-distended EXTREMITIES:  No edema; No deformity   EKG/LABS/ Recent Cardiac Studies   ECG personally reviewed by me today - ***  Risk Assessment/Calculations:   {Does this patient have ATRIAL FIBRILLATION?[phone]}  Lab Results  Component Value Date   WBC 8.0 06/11/2020   HGB 15.7 06/11/2020   HCT 47.8 06/11/2020   MCV 92 06/11/2020   PLT 327 06/11/2020   Lab Results  Component Value Date   CREATININE 1.13 06/11/2020   BUN 12 06/11/2020   NA 139 06/11/2020   K 4.0 06/11/2020   CL 103 06/11/2020   CO2 27 06/11/2020   No results found for: "CHOL", "HDL", "LDLCALC", "LDLDIRECT", "TRIG", "CHOLHDL"  No results found for: "HGBA1C" Assessment & Plan    1.  HFrEF: - Cardiac MR completed 03/2020  with severely decreased LV function and transmural late gadolinium enhancement due to hyper trabeculations - Patient was seen in follow-up in January and had discontinued his medications that were restarted.  2.  Essential hypertension: - Patient's blood pressure today was***  3.  NICM: -s/p [employer] [name]-chamber ICD for primary prevention  4.  DM type II  5.  History of OSA:      Disposition: Follow-up with Richard Alert, MD or APP in *** months {Are you ordering a CV Procedure (e.g. stress test, cath, DCCV, TEE, etc)?   Press F2        :[id number]}   Signed, Richard [name], Richard Cast, NP 04/18/2024, 4:49 PM [facility]

## 2024-04-20 ENCOUNTER — Ambulatory Visit: Admitting: Nurse Practitioner

## 2024-04-20 DIAGNOSIS — Z95810 Presence of automatic (implantable) cardiac defibrillator: Secondary | ICD-10-CM

## 2024-04-20 DIAGNOSIS — E118 Type 2 diabetes mellitus with unspecified complications: Secondary | ICD-10-CM

## 2024-04-20 DIAGNOSIS — I5022 Chronic systolic (congestive) heart failure: Secondary | ICD-10-CM

## 2024-04-20 DIAGNOSIS — I428 Other cardiomyopathies: Secondary | ICD-10-CM

## 2024-04-20 NOTE — Addendum Note (Signed)
 Addended by: Lott Rouleau A on: 04/20/2024 10:30 AM   Modules accepted: Orders

## 2024-04-20 NOTE — Progress Notes (Signed)
 Remote ICD transmission.

## 2024-06-09 ENCOUNTER — Ambulatory Visit (INDEPENDENT_AMBULATORY_CARE_PROVIDER_SITE_OTHER): Payer: Self-pay

## 2024-06-09 DIAGNOSIS — I428 Other cardiomyopathies: Secondary | ICD-10-CM

## 2024-06-09 LAB — CUP PACEART REMOTE DEVICE CHECK
Battery Remaining Longevity: 102 mo
Battery Remaining Percentage: 73 %
Brady Statistic RV Percent Paced: 0 %
Date Time Interrogation Session: 20250619050100
HighPow Impedance: 81 Ohm
Implantable Lead Connection Status: 753985
Implantable Lead Implant Date: 20210623
Implantable Lead Location: 753860
Implantable Lead Model: 138
Implantable Lead Serial Number: 302960
Implantable Pulse Generator Implant Date: 20210623
Lead Channel Impedance Value: 544 Ohm
Lead Channel Setting Pacing Amplitude: 2.5 V
Lead Channel Setting Pacing Pulse Width: 0.4 ms
Lead Channel Setting Sensing Sensitivity: 0.5 mV
Pulse Gen Serial Number: 205833
Zone Setting Status: 755011

## 2024-06-10 ENCOUNTER — Ambulatory Visit: Payer: Self-pay | Admitting: Cardiology

## 2024-06-28 NOTE — Progress Notes (Deleted)
 Cardiology Office Note    Patient Name: Richard [name] Date of Encounter: 06/28/2024  Primary Care Provider:  Barbarann Mallie DEL, PA Primary Cardiologist:  Richard Passe, MD Primary Electrophysiologist: None   Past Medical History    Past Medical History:  Diagnosis Date   Anxiety    Chronic systolic CHF 05/09/2019   Echocardiogram 12/2019: EF < 20, severe LVH, Gr 1 DD, trabeculations c/w LV non-compaction   Depression    Diabetes mellitus, type 2 (HCC)    GERD (gastroesophageal reflux disease)    Hyperlipidemia    Hypertension    Hypertrophic obstructive cardiomyopathy    Insomnia    Left ventricular dysfunction    EF 35%   Personality disorder (HCC)    PTSD (post-traumatic stress disorder)    Sleep apnea     History of Present Illness   Richard [name] is a 63 y.o. male with a PMH of HOCM, DM type II, HTN, HLD, HFrEF, NICM PTSD, OSA (on CPAP), obesity, anxiety who presents today for 44-month follow-up.   Richard [name] was seen initially by Richard [name] in 09/2019 to reestablish care after being followed at [facility].  He underwent a LHC in 2002 that showed normal coronaries with high output heart failure.  2D echo was completed at that time with EF of 25-30% and mild diastolic dysfunction.  He was started on Entresto  and had lisinopril  discontinued.  He was seen in follow-up by Richard Ferrier, PA on 11/11/2019 and was noted to have stable volume with lightheadedness and no further titration was made at that time.  He underwent a limited echo and 12/2019 that showed EF less than 20%.  He was referred to the advanced heart failure clinic and seen by Richard [name].  He was noted to have deep LV trabeculations and underwent cardiac MR that showed transmural late gadolinium enhancement and was referred to EP for ICD placement.  He underwent placement of a [employer] single-chamber ICD on 06/13/2020.  He was last seen by Richard [name] on 05/2022 and was compliant with medications.   He was seen by Richard [name] on 01/04/2024 and reported not taking his medications due to anxiety.  He was resumed on carvedilol , Entresto , Farxiga  and spironolactone .     Patient denies chest pain, palpitations, dyspnea, PND, orthopnea, nausea, vomiting, dizziness, syncope, edema, weight gain, or early satiety.   Discussed the use of AI scribe software for clinical note transcription with the patient, who gave verbal consent to proceed.  History of Present Illness    ***Notes:   Review of Systems  Please see the history of present illness.    All other systems reviewed and are otherwise negative except as noted above.  Physical Exam    Wt Readings from Last 3 Encounters:  01/04/24 266 lb (120.7 kg)  06/09/22 289 lb 12.8 oz (131.5 kg)  01/16/21 270 lb (122.5 kg)   CD:Uyzmz were no vitals filed for this visit.,There is no height or weight on file to calculate BMI. GEN: Well nourished, well developed in no acute distress Neck: No JVD; No carotid bruits Pulmonary: Clear to auscultation without rales, wheezing or rhonchi  Cardiovascular: Normal rate. Regular rhythm. Normal S1. Normal S2.   Murmurs: There is no murmur.  ABDOMEN: Soft, non-tender, non-distended EXTREMITIES:  No edema; No deformity   EKG/LABS/ Recent Cardiac Studies   ECG personally reviewed by me today - ***  Risk Assessment/Calculations:   {Does this patient have ATRIAL FIBRILLATION?:[phone]}  Lab Results  Component Value Date   WBC 8.0 06/11/2020   HGB 15.7 06/11/2020   HCT 47.8 06/11/2020   MCV 92 06/11/2020   PLT 327 06/11/2020   Lab Results  Component Value Date   CREATININE 1.13 06/11/2020   BUN 12 06/11/2020   NA 139 06/11/2020   K 4.0 06/11/2020   CL 103 06/11/2020   CO2 27 06/11/2020   No results found for: CHOL, HDL, LDLCALC, LDLDIRECT, TRIG, CHOLHDL  No results found for: HGBA1C Assessment & Plan    Assessment and Plan Assessment & Plan   1.  HFrEF: - Cardiac MR  completed 03/2020 with severely decreased LV function and transmural late gadolinium enhancement due to hyper trabeculations - Patient was seen in follow-up in January and had discontinued his medications that were restarted.   2.  Essential hypertension: - Patient's blood pressure today was***   3.  NICM: -s/p [employer] single-chamber ICD for primary prevention   4.  DM type II   5.  History of OSA:      Disposition: Follow-up with Richard Passe, MD or APP in *** months {Are you ordering a CV Procedure (e.g. stress test, cath, DCCV, TEE, etc)?   Press F2        :[id number]}   Signed, Richard [name], Richard Shove, NP 06/28/2024, 8:21 PM [facility]

## 2024-06-29 ENCOUNTER — Ambulatory Visit: Admitting: Nurse Practitioner

## 2024-06-29 DIAGNOSIS — Z95810 Presence of automatic (implantable) cardiac defibrillator: Secondary | ICD-10-CM

## 2024-06-29 DIAGNOSIS — E118 Type 2 diabetes mellitus with unspecified complications: Secondary | ICD-10-CM

## 2024-06-29 DIAGNOSIS — I5022 Chronic systolic (congestive) heart failure: Secondary | ICD-10-CM

## 2024-06-29 DIAGNOSIS — I428 Other cardiomyopathies: Secondary | ICD-10-CM

## 2024-06-29 DIAGNOSIS — G4733 Obstructive sleep apnea (adult) (pediatric): Secondary | ICD-10-CM

## 2024-08-10 NOTE — Progress Notes (Signed)
 Remote ICD transmission.

## 2024-09-08 ENCOUNTER — Ambulatory Visit (INDEPENDENT_AMBULATORY_CARE_PROVIDER_SITE_OTHER): Payer: Self-pay

## 2024-09-08 ENCOUNTER — Ambulatory Visit: Payer: Self-pay | Admitting: Cardiology

## 2024-09-08 DIAGNOSIS — I428 Other cardiomyopathies: Secondary | ICD-10-CM | POA: Diagnosis not present

## 2024-09-08 LAB — CUP PACEART REMOTE DEVICE CHECK
Battery Remaining Longevity: 102 mo
Battery Remaining Percentage: 72 %
Brady Statistic RV Percent Paced: 0 %
Date Time Interrogation Session: 20250918050100
HighPow Impedance: 84 Ohm
Implantable Lead Connection Status: 753985
Implantable Lead Implant Date: 20210623
Implantable Lead Location: 753860
Implantable Lead Model: 138
Implantable Lead Serial Number: 302960
Implantable Pulse Generator Implant Date: 20210623
Lead Channel Impedance Value: 567 Ohm
Lead Channel Setting Pacing Amplitude: 2.5 V
Lead Channel Setting Pacing Pulse Width: 0.4 ms
Lead Channel Setting Sensing Sensitivity: 0.5 mV
Pulse Gen Serial Number: 205833
Zone Setting Status: 755011

## 2024-09-13 ENCOUNTER — Encounter: Payer: Self-pay | Admitting: *Deleted

## 2024-09-13 NOTE — Progress Notes (Unsigned)
 Cardiology Office Note    Patient Name: Richard [name] Date of Encounter: 09/13/2024  Primary Care Provider:  Barbarann Mallie DEL, PA Primary Cardiologist:  Aleene Passe, MD (Inactive) Primary Electrophysiologist: None   Past Medical History    Past Medical History:  Diagnosis Date   Anxiety    Chronic systolic CHF 05/09/2019   Echocardiogram 12/2019: EF < 20, severe LVH, Gr 1 DD, trabeculations c/w LV non-compaction   Depression    Diabetes mellitus, type 2 (HCC)    GERD (gastroesophageal reflux disease)    Hyperlipidemia    Hypertension    Hypertrophic obstructive cardiomyopathy    Insomnia    Left ventricular dysfunction    EF 35%   Personality disorder (HCC)    PTSD (post-traumatic stress disorder)    Sleep apnea     History of Present Illness  Richard [name] is a 63 y.o. male with a PMH of HOCM, DM type II, HTN, HLD, HFrEF, NICM PTSD, OSA (on CPAP), obesity, anxiety who presents today for 61-month follow-up.   Richard [name] was seen initially by Richard [name] in 09/2019 to reestablish care after being followed at [facility].  He underwent a LHC in 2002 that showed normal coronaries with high output heart failure.  2D echo was completed at that time with EF of 25-30% and mild diastolic dysfunction.  He was started on Entresto  and had lisinopril  discontinued.  He was seen in follow-up by Richard Ferrier, PA on 11/11/2019 and was noted to have stable volume with lightheadedness and no further titration was made at that time.  He underwent a limited echo and 12/2019 that showed EF less than 20%.  He was referred to the advanced heart failure clinic and seen by Richard [name].  He was noted to have deep LV trabeculations and underwent cardiac MR that showed transmural late gadolinium enhancement and was referred to EP for ICD placement.  He underwent placement of a [employer] single-chamber ICD on 06/13/2020.  He was last seen by Richard [name] on 05/2022 and was compliant with  medications.  He was seen by Richard [name] on 01/04/2024 and reported not taking his medications due to anxiety.  He was resumed on carvedilol , Entresto , Farxiga  and spironolactone .  Richard [name] was seen today for annual follow-up.  He reports today doing well from a cardiac standpoint. He has been off his cardiac medications, including carvedilol , Farxiga , Entresto , and spironolactone , for a couple of weeks due to a lapse in automatic refills from his pharmacy, [place]. No shortness of breath or swelling during this period. He reports that when medicated, his blood pressure is usually around 110/85 mmHg, and that unmedicated, it has not been above about 145 mmHg for the past 40 years. He experiences poor sleep quality, describing himself as a light sleeper. He wakes up with pain in his hips, legs, and shoulders, which resolves after changing positions. He attributes his sleep issues partly to sleep apnea, for which he has not used a CPAP and is awaiting a new sleep study. He has a history of anxiety, ADHD, autism, and OCD, which contribute to his 'white coat anxiety.' He was diagnosed with heart failure after experiencing severe GERD, initially suspected to be a heart attack, leading to a stress test and subsequent cardiac evaluation. He has a family history of vascular issues, noting his grandmother had multiple TIAs and underwent carotid endarterectomy. Patient denies chest pain, palpitations, dyspnea, PND, orthopnea, nausea, vomiting, dizziness, syncope, edema, weight gain, or early satiety.  Discussed the use of AI scribe software for clinical note transcription with the patient, who gave verbal consent to proceed.  History of Present Illness   Review of Systems  Please see the history of present illness.    All other systems reviewed and are otherwise negative except as noted above.  Physical Exam    Wt Readings from Last 3 Encounters:  01/04/24 266 lb (120.7 kg)  06/09/22 289 lb 12.8 oz (131.5  kg)  01/16/21 270 lb (122.5 kg)   CD:Uyzmz were no vitals filed for this visit.,There is no height or weight on file to calculate BMI. GEN: Well nourished, well developed in no acute distress Neck: No JVD; No carotid bruits Pulmonary: Clear to auscultation without rales, wheezing or rhonchi  Cardiovascular: Normal rate. Regular rhythm. Normal S1. Normal S2.   Murmurs: There is no murmur.  ABDOMEN: Soft, non-tender, non-distended EXTREMITIES:  No edema; No deformity   EKG/LABS/ Recent Cardiac Studies   ECG personally reviewed by me today -none completed today  Risk Assessment/Calculations:          Lab Results  Component Value Date   WBC 8.0 06/11/2020   HGB 15.7 06/11/2020   HCT 47.8 06/11/2020   MCV 92 06/11/2020   PLT 327 06/11/2020   Lab Results  Component Value Date   CREATININE 1.13 06/11/2020   BUN 12 06/11/2020   NA 139 06/11/2020   K 4.0 06/11/2020   CL 103 06/11/2020   CO2 27 06/11/2020   No results found for: CHOL, HDL, LDLCALC, LDLDIRECT, TRIG, CHOLHDL  No results found for: HGBA1C Assessment & Plan    Assessment & Plan  1.  HFrEF: - Cardiac MR completed 03/2020 with severely decreased LV function and transmural late gadolinium enhancement due to hyper trabeculations - Patient was seen in follow-up in January and had discontinued his medications that were restarted. -No recent exacerbations or ICD activations. Blood pressure slightly elevated due to medication non-adherence influenced by anxiety. - Renew prescriptions for carvedilol , Farxiga , Entresto , and spironolactone . - Instruct to resume all cardiac medications. - Discuss benefits of weight loss and improved physical fitness. - Consider GLP-1 agonists for weight management with primary care physician.   2.  Essential hypertension: - Patient's blood pressure today was initially elevated at 142/86 and was 138/88 on recheck -Continue carvedilol  25 mg twice daily, spironolactone  25 mg  daily  - Prescribe blood pressure cuff for home monitoring. - Instruct to monitor blood pressure at home after resuming medications.  3.  NICM: -s/p [employer] single-chamber ICD for primary prevention -Recent Paceart report shows no arrhythmias with normal battery life and stable lead function. -Patient will establish with new EP Dr. Due to Dr. Celine retirement in October.   4.  DM type II: - No current hemoglobin A1c on file but patient is scheduled to establish care with new PCP. -Continue glipizide, Farxiga , Glucophage as prescribed   5.  History of OSA: - Patient reports that he is currently not using his CPAP but is scheduled to have a new sleep study completed.  Disposition: Follow-up with Aleene Passe, MD (Inactive) or APP in 6 months   Signed, Wyn Raddle, Jackee Shove, NP 09/13/2024, 7:34 PM [facility]

## 2024-09-13 NOTE — Progress Notes (Signed)
Remote ICD Transmission.

## 2024-09-14 ENCOUNTER — Other Ambulatory Visit (HOSPITAL_COMMUNITY): Payer: Self-pay

## 2024-09-14 ENCOUNTER — Ambulatory Visit: Attending: Cardiology | Admitting: Nurse Practitioner

## 2024-09-14 ENCOUNTER — Encounter: Payer: Self-pay | Admitting: Nurse Practitioner

## 2024-09-14 VITALS — BP 138/88 | HR 97 | Ht 73.0 in | Wt 269.2 lb

## 2024-09-14 DIAGNOSIS — I10 Essential (primary) hypertension: Secondary | ICD-10-CM | POA: Insufficient documentation

## 2024-09-14 DIAGNOSIS — Z95810 Presence of automatic (implantable) cardiac defibrillator: Secondary | ICD-10-CM | POA: Insufficient documentation

## 2024-09-14 DIAGNOSIS — I428 Other cardiomyopathies: Secondary | ICD-10-CM | POA: Diagnosis not present

## 2024-09-14 DIAGNOSIS — I5022 Chronic systolic (congestive) heart failure: Secondary | ICD-10-CM | POA: Insufficient documentation

## 2024-09-14 DIAGNOSIS — G4733 Obstructive sleep apnea (adult) (pediatric): Secondary | ICD-10-CM | POA: Diagnosis present

## 2024-09-14 DIAGNOSIS — E118 Type 2 diabetes mellitus with unspecified complications: Secondary | ICD-10-CM | POA: Diagnosis present

## 2024-09-14 MED ORDER — SACUBITRIL-VALSARTAN 97-103 MG PO TABS: 1.0000 | ORAL_TABLET | Freq: Two times a day (BID) | ORAL | 1 refills | Status: AC

## 2024-09-14 MED ORDER — SPIRONOLACTONE 25 MG PO TABS: 25.0000 mg | ORAL_TABLET | Freq: Every day | ORAL | 1 refills | Status: AC

## 2024-09-14 MED ORDER — OMRON 3 SERIES BP MONITOR DEVI
0 refills | Status: AC
  Filled 2024-09-14: qty 1, 30d supply, fill #0

## 2024-09-14 MED ORDER — CARVEDILOL 25 MG PO TABS: 25.0000 mg | ORAL_TABLET | Freq: Two times a day (BID) | ORAL | 1 refills | Status: AC

## 2024-09-14 NOTE — Patient Instructions (Signed)
 Medication Instructions:  A prescription for a blood pressure cuff has been sent to the pharmacy downstairs. Please stop by to pick it up. The cost is approximately $35, though some insurances plans may cover it. Be sure to use the cuff regularly to monitor blood pressure as recommended by your provider.   *If you need a refill on your cardiac medications before your next appointment, please call your pharmacy*  Lab Work: None If you have labs (blood work) drawn today and your tests are completely normal, you will receive your results only by: MyChart Message (if you have MyChart) OR A paper copy in the mail If you have any lab test that is abnormal or we need to change your treatment, we will call you to review the results.  Testing/Procedures: None  Follow-Up: At [facility], you and your health needs are our priority.  As part of our continuing mission to provide you with exceptional heart care, our providers are all part of one team.  This team includes your primary Cardiologist (physician) and Advanced Practice Providers or APPs (Physician Assistants and Nurse Practitioners) who all work together to provide you with the care you need, when you need it.  Your next appointment:   6 month(s)  Provider:   Donnice Primus, MD  (EP), taking the place of Dr. Elspeth Sage Dr. Emeline Calender, taking the place of Dr. Aleene Passe.  We recommend signing up for the patient portal called MyChart.  Sign up information is provided on this After Visit Summary.  MyChart is used to connect with patients for Virtual Visits (Telemedicine).  Patients are able to view lab/test results, encounter notes, upcoming appointments, etc.  Non-urgent messages can be sent to your provider as well.   To learn more about what you can do with MyChart, go to [email or web address].   Other Instructions None

## 2024-09-26 ENCOUNTER — Other Ambulatory Visit (HOSPITAL_COMMUNITY): Payer: Self-pay

## 2024-12-08 DIAGNOSIS — I428 Other cardiomyopathies: Secondary | ICD-10-CM

## 2024-12-09 LAB — CUP PACEART REMOTE DEVICE CHECK
Battery Remaining Longevity: 96 mo
Battery Remaining Percentage: 70 %
Brady Statistic RV Percent Paced: 0 %
Date Time Interrogation Session: 20251218050100
HighPow Impedance: 83 Ohm
Implantable Lead Connection Status: 753985
Implantable Lead Implant Date: 20210623
Implantable Lead Location: 753860
Implantable Lead Model: 138
Implantable Lead Serial Number: 302960
Implantable Pulse Generator Implant Date: 20210623
Lead Channel Impedance Value: 555 Ohm
Lead Channel Setting Pacing Amplitude: 2.5 V
Lead Channel Setting Pacing Pulse Width: 0.4 ms
Lead Channel Setting Sensing Sensitivity: 0.5 mV
Pulse Gen Serial Number: 205833
Zone Setting Status: 755011

## 2024-12-09 NOTE — Progress Notes (Signed)
 Remote ICD Transmission

## 2024-12-16 ENCOUNTER — Ambulatory Visit: Payer: Self-pay | Admitting: Cardiology
